# Patient Record
Sex: Female | Born: 1990 | Race: Black or African American | Hispanic: No | Smoking: Never smoker
Health system: Southern US, Community
[De-identification: ages and names within clinical notes are randomized; demographics above are authoritative.]

## PROBLEM LIST (undated history)

## (undated) DIAGNOSIS — F419 Anxiety disorder, unspecified: Secondary | ICD-10-CM

## (undated) DIAGNOSIS — R000 Tachycardia, unspecified: Secondary | ICD-10-CM

## (undated) DIAGNOSIS — M1990 Unspecified osteoarthritis, unspecified site: Secondary | ICD-10-CM

## (undated) HISTORY — PX: ABDOMINAL HYSTERECTOMY: SHX81

---

## 2018-09-18 ENCOUNTER — Emergency Department
Admission: EM | Admit: 2018-09-18 | Discharge: 2018-09-19 | Disposition: A | Discharge status: Home / Self Care | Payer: BLUE CROSS/BLUE SHIELD | Attending: Emergency Medicine | Admitting: Emergency Medicine

## 2018-09-18 ENCOUNTER — Encounter: Payer: Self-pay | Admitting: Emergency Medicine

## 2018-09-18 ENCOUNTER — Other Ambulatory Visit: Payer: Self-pay

## 2018-09-18 DIAGNOSIS — R1030 Lower abdominal pain, unspecified: Secondary | ICD-10-CM | POA: Insufficient documentation

## 2018-09-18 DIAGNOSIS — T7421XA Adult sexual abuse, confirmed, initial encounter: Secondary | ICD-10-CM

## 2018-09-18 LAB — URINE DRUG SCREEN, QUALITATIVE (ARMC ONLY)
Amphetamines, Ur Screen: NOT DETECTED
Barbiturates, Ur Screen: NOT DETECTED
Benzodiazepine, Ur Scrn: NOT DETECTED
Cannabinoid 50 Ng, Ur ~~LOC~~: NOT DETECTED
Cocaine Metabolite,Ur ~~LOC~~: NOT DETECTED
MDMA (Ecstasy)Ur Screen: NOT DETECTED
Methadone Scn, Ur: NOT DETECTED
Opiate, Ur Screen: NOT DETECTED
Phencyclidine (PCP) Ur S: NOT DETECTED
Tricyclic, Ur Screen: NOT DETECTED

## 2018-09-18 LAB — URINALYSIS, COMPLETE (UACMP) WITH MICROSCOPIC
Bacteria, UA: NONE SEEN
Bilirubin Urine: NEGATIVE
Glucose, UA: NEGATIVE mg/dL
Hgb urine dipstick: NEGATIVE
Ketones, ur: NEGATIVE mg/dL
Leukocytes,Ua: NEGATIVE
Nitrite: NEGATIVE
Protein, ur: NEGATIVE mg/dL
Specific Gravity, Urine: 1.021 (ref 1.005–1.030)
pH: 6 (ref 5.0–8.0)

## 2018-09-18 LAB — CBC
HCT: 36.7 % (ref 36.0–46.0)
Hemoglobin: 12.4 g/dL (ref 12.0–15.0)
MCH: 28.4 pg (ref 26.0–34.0)
MCHC: 33.8 g/dL (ref 30.0–36.0)
MCV: 84.2 fL (ref 80.0–100.0)
Platelets: 175 10*3/uL (ref 150–400)
RBC: 4.36 MIL/uL (ref 3.87–5.11)
RDW: 13.1 % (ref 11.5–15.5)
WBC: 8.1 10*3/uL (ref 4.0–10.5)
nRBC: 0 % (ref 0.0–0.2)

## 2018-09-18 LAB — COMPREHENSIVE METABOLIC PANEL
ALT: 19 U/L (ref 0–44)
AST: 23 U/L (ref 15–41)
Albumin: 4.1 g/dL (ref 3.5–5.0)
Alkaline Phosphatase: 63 U/L (ref 38–126)
Anion gap: 10 (ref 5–15)
BUN: 16 mg/dL (ref 6–20)
CO2: 27 mmol/L (ref 22–32)
Calcium: 8.8 mg/dL — ABNORMAL LOW (ref 8.9–10.3)
Chloride: 101 mmol/L (ref 98–111)
Creatinine, Ser: 0.7 mg/dL (ref 0.44–1.00)
GFR calc Af Amer: >60 mL/min (ref >60)
GFR calc non Af Amer: >60 mL/min (ref >60)
Glucose, Bld: 90 mg/dL (ref 70–99)
Potassium: 3.6 mmol/L (ref 3.5–5.1)
Sodium: 138 mmol/L (ref 135–145)
Total Bilirubin: 0.6 mg/dL (ref 0.3–1.2)
Total Protein: 7.5 g/dL (ref 6.5–8.1)

## 2018-09-18 LAB — HCG, QUANTITATIVE, PREGNANCY: hCG, Beta Chain, Quant, S: <1 m[IU]/mL (ref <5)

## 2018-09-18 MED ORDER — IBUPROFEN 400 MG PO TABS
400.0000 mg | ORAL_TABLET | Freq: Once | ORAL | Status: AC
  Administered 2018-09-18: 400 mg via ORAL
  Filled 2018-09-18: qty 1

## 2018-09-18 NOTE — SANE Note (Signed)
On 09/18/2018, at approximately 2345 hours, the SANE/FNE Naval architect) consult was completed. The primary RN and physician were notified. Please contact the SANE/FNE nurse on call (listed in [place]) with any further concerns.

## 2018-09-18 NOTE — ED Notes (Signed)
[facility] representative called back, they are not currently sending people out into the hospital but are offering phone services.  Spoke with pt and is she going to follow up with [facility] in [facility].
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
## 2018-09-18 NOTE — ED Notes (Signed)
Pt is requesting someone from [facility] to come and speak with her. Called [facility] 24 hour phone number [phone]). Left phone number for someone to call back.  Officer Isley aware and will also try to continue to contact them.
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
## 2018-09-18 NOTE — SANE Note (Signed)
ON 09/18/2018, AT APPROXIMATELY 2115 HOURS, I OBSERVED LAW ENFORCEMENT AND AN ADVOCATE TO BE PRESENT WITH THE PT IN [facility] ED ROOM # 5.

## 2018-09-18 NOTE — ED Notes (Signed)
ED Provider at bedside. 
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
## 2018-09-25 ENCOUNTER — Emergency Department: Payer: BLUE CROSS/BLUE SHIELD

## 2018-09-25 ENCOUNTER — Other Ambulatory Visit: Payer: Self-pay

## 2018-09-25 ENCOUNTER — Emergency Department
Admission: EM | Admit: 2018-09-25 | Discharge: 2018-09-26 | Disposition: A | Discharge status: Home / Self Care | Payer: BLUE CROSS/BLUE SHIELD | Attending: Emergency Medicine | Admitting: Emergency Medicine

## 2018-09-25 ENCOUNTER — Encounter: Payer: Self-pay | Admitting: *Deleted

## 2018-09-25 DIAGNOSIS — R109 Unspecified abdominal pain: Secondary | ICD-10-CM

## 2018-09-25 DIAGNOSIS — G8929 Other chronic pain: Secondary | ICD-10-CM | POA: Diagnosis not present

## 2018-09-25 DIAGNOSIS — R1030 Lower abdominal pain, unspecified: Secondary | ICD-10-CM | POA: Diagnosis not present

## 2018-09-25 LAB — URINALYSIS, COMPLETE (UACMP) WITH MICROSCOPIC
Bacteria, UA: NONE SEEN
Bilirubin Urine: NEGATIVE
Glucose, UA: NEGATIVE mg/dL
Hgb urine dipstick: NEGATIVE
Ketones, ur: NEGATIVE mg/dL
Leukocytes,Ua: NEGATIVE
Nitrite: NEGATIVE
Protein, ur: NEGATIVE mg/dL
Specific Gravity, Urine: 1.006 (ref 1.005–1.030)
pH: 5 (ref 5.0–8.0)

## 2018-09-25 LAB — CBC
HCT: 36.7 % (ref 36.0–46.0)
Hemoglobin: 12.3 g/dL (ref 12.0–15.0)
MCH: 28.5 pg (ref 26.0–34.0)
MCHC: 33.5 g/dL (ref 30.0–36.0)
MCV: 85.2 fL (ref 80.0–100.0)
Platelets: 190 10*3/uL (ref 150–400)
RBC: 4.31 MIL/uL (ref 3.87–5.11)
RDW: 13.1 % (ref 11.5–15.5)
WBC: 7.8 10*3/uL (ref 4.0–10.5)
nRBC: 0 % (ref 0.0–0.2)

## 2018-09-25 LAB — COMPREHENSIVE METABOLIC PANEL
ALT: 17 U/L (ref 0–44)
AST: 22 U/L (ref 15–41)
Albumin: 4.2 g/dL (ref 3.5–5.0)
Alkaline Phosphatase: 53 U/L (ref 38–126)
Anion gap: 8 (ref 5–15)
BUN: 13 mg/dL (ref 6–20)
CO2: 23 mmol/L (ref 22–32)
Calcium: 8.5 mg/dL — ABNORMAL LOW (ref 8.9–10.3)
Chloride: 108 mmol/L (ref 98–111)
Creatinine, Ser: 0.69 mg/dL (ref 0.44–1.00)
GFR calc Af Amer: >60 mL/min (ref >60)
GFR calc non Af Amer: >60 mL/min (ref >60)
Glucose, Bld: 87 mg/dL (ref 70–99)
Potassium: 3.7 mmol/L (ref 3.5–5.1)
Sodium: 139 mmol/L (ref 135–145)
Total Bilirubin: 0.5 mg/dL (ref 0.3–1.2)
Total Protein: 7.3 g/dL (ref 6.5–8.1)

## 2018-09-25 LAB — POCT PREGNANCY, URINE: Preg Test, Ur: NEGATIVE

## 2018-09-25 LAB — LIPASE, BLOOD: Lipase: 39 U/L (ref 11–51)

## 2018-09-25 MED ORDER — KETOROLAC TROMETHAMINE 30 MG/ML IJ SOLN
30.0000 mg | Freq: Once | INTRAMUSCULAR | Status: AC
  Administered 2018-09-25: 30 mg via INTRAVENOUS
  Filled 2018-09-25: qty 1

## 2018-09-25 MED ORDER — SODIUM CHLORIDE 0.9 % IV SOLN
Freq: Once | INTRAVENOUS | Status: AC
  Administered 2018-09-25: 22:00:00 via INTRAVENOUS

## 2018-09-25 MED ORDER — KETOROLAC TROMETHAMINE 60 MG/2ML IM SOLN: 60.0000 mg | Freq: Once | INTRAMUSCULAR | Status: DC

## 2018-09-25 MED ORDER — HYDROCODONE-ACETAMINOPHEN 5-325 MG PO TABS
2.0000 | ORAL_TABLET | Freq: Once | ORAL | Status: AC
  Administered 2018-09-25: 2 via ORAL
  Filled 2018-09-25: qty 2

## 2018-09-25 MED ORDER — IOHEXOL 300 MG/ML  SOLN
100.0000 mL | Freq: Once | INTRAMUSCULAR | Status: AC | PRN
  Administered 2018-09-25: 100 mL via INTRAVENOUS
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
## 2018-09-25 NOTE — ED Notes (Signed)
Pt requesting to hold off on bloodwork until she is seen by a doctor. Pt reporting UC sent her for the CT scan but she recently had blood work done.

## 2018-09-25 NOTE — ED Triage Notes (Signed)
First Nurse Note:  Arrives from [place] for ED evaluation of abdominal pain, dizziness, and blood in stool.    Patient is AAOx3. Skin warm and dry.  MAE equally and strong.  Ambulates with easy and steady gait.  NAD

## 2018-09-25 NOTE — ED Provider Notes (Signed)
-----------------------------------------   11:19 PM on 09/25/2018 -----------------------------------------   Blood pressure 126/86, pulse 87, temperature 99.2 F (37.3 C), temperature source Oral, resp. rate 16, height 5\' 4"  (1.626 m), weight 81.6 kg, SpO2 99 %.  Assuming care from Dr. Jimmye Norman of [facility] Ranieri is a 28 y.o. female with a chief complaint of Abdominal Pain and Nausea .    Please refer to H&P by previous MD for further details.  The current plan of care is to follow-up results of CT scan.  _________________________ 12:21 AM on 09/26/2018 -----------------------------------------  CT negative for any acute findings that could explain patient's pain which seems to be a chronic issue. Recommended f/u with her doctor. Discussed my standard return precautions.    I have personally reviewed the images performed during this visit and I agree with the Radiologist's read.   Interpretation by Radiologist:  Ct Abdomen Pelvis W Contrast  Result Date: 09/25/2018 CLINICAL DATA:  28 year old female with abdominal pain. EXAM: CT ABDOMEN AND PELVIS WITH CONTRAST TECHNIQUE: Multidetector CT imaging of the abdomen and pelvis was performed using the standard protocol following bolus administration of intravenous contrast. CONTRAST:  158mL OMNIPAQUE IOHEXOL 300 MG/ML  SOLN COMPARISON:  None. FINDINGS: Evaluation of this exam is limited due to respiratory motion artifact. Lower chest: The visualized lung bases are clear. No intra-abdominal free air or free fluid. Hepatobiliary: No focal liver abnormality is seen. No gallstones, gallbladder wall thickening, or biliary dilatation. Pancreas: Unremarkable. No pancreatic ductal dilatation or surrounding inflammatory changes. Spleen: Normal in size without focal abnormality. Adrenals/Urinary Tract: Adrenal glands are unremarkable. Kidneys are normal, without renal calculi, focal lesion, or hydronephrosis. Bladder is unremarkable. Stomach/Bowel: There is  moderate stool throughout the colon. There is no bowel obstruction or active inflammation. Normal appendix. Vascular/Lymphatic: No significant vascular findings are present. No enlarged abdominal or pelvic lymph nodes. Reproductive: Hysterectomy. No pelvic mass. A 2 cm right ovarian, probably ruptured, corpus luteum. The left ovary is unremarkable. Other: Small fat containing umbilical hernia. There is stranding of the fat within the umbilical hernia which may represent strangulation or incarceration. Correlation with clinical exam or point tenderness recommended. No fluid collection. Musculoskeletal: No acute or significant osseous findings. IMPRESSION: 1. A 2 cm probably ruptured right ovarian corpus luteum. 2. No bowel obstruction or active inflammation. Normal appendix. Electronically Signed   By: Anner Crete M.D.   On: 09/25/2018 23:31         Rudene Re, MD 09/26/18 Benancio Deeds
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
## 2018-09-26 NOTE — Discharge Instructions (Addendum)

## 2018-09-26 NOTE — ED Notes (Signed)
Pt c/o lower abdominal pain that pt describes as constant. Pt endorses constant nausea with abdominal pain as well. Pt denies vomiting or fever at home. Pt in NAD at this time.

## 2018-11-01 ENCOUNTER — Ambulatory Visit
Admission: EM | Admit: 2018-11-01 | Discharge: 2018-11-01 | Disposition: A | Discharge status: Home / Self Care | Payer: BLUE CROSS/BLUE SHIELD | Attending: Emergency Medicine | Admitting: Emergency Medicine

## 2018-11-01 ENCOUNTER — Other Ambulatory Visit: Payer: Self-pay

## 2018-11-01 ENCOUNTER — Encounter: Payer: Self-pay | Admitting: Emergency Medicine

## 2018-11-01 DIAGNOSIS — R300 Dysuria: Secondary | ICD-10-CM

## 2018-11-01 DIAGNOSIS — B373 Candidiasis of vulva and vagina: Secondary | ICD-10-CM

## 2018-11-01 DIAGNOSIS — B3731 Acute candidiasis of vulva and vagina: Secondary | ICD-10-CM

## 2018-11-01 LAB — URINALYSIS, COMPLETE (UACMP) WITH MICROSCOPIC
Bacteria, UA: NONE SEEN
Bilirubin Urine: NEGATIVE
Glucose, UA: NEGATIVE mg/dL
Hgb urine dipstick: NEGATIVE
Ketones, ur: NEGATIVE mg/dL
Nitrite: NEGATIVE
Protein, ur: NEGATIVE mg/dL
RBC / HPF: NONE SEEN RBC/hpf (ref 0–5)
Specific Gravity, Urine: 1.015 (ref 1.005–1.030)
pH: 6 (ref 5.0–8.0)

## 2018-11-01 LAB — WET PREP, GENITAL
Clue Cells Wet Prep HPF POC: NONE SEEN
Sperm: NONE SEEN
Trich, Wet Prep: NONE SEEN

## 2018-11-01 MED ORDER — FLUCONAZOLE 150 MG PO TABS: 150.0000 mg | ORAL_TABLET | Freq: Every day | ORAL | 0 refills | Status: DC

## 2018-11-01 NOTE — Discharge Instructions (Addendum)
Take medication as prescribed. Rest. Drink plenty of fluids. Pelvic rest.  ° °Follow up with your primary care physician this week as needed. Return to Urgent care for new or worsening concerns.  ° °

## 2018-11-01 NOTE — ED Provider Notes (Signed)
[facility] ____________________________________________  Time seen: Approximately 5:20 PM  I have reviewed the triage vital signs and the nursing notes.   HISTORY  Chief Complaint Urinary Frequency   HPI Mandy Robertson is a 28 y.o. female presenting for evaluation of vaginal discharge present since Saturday.  States vaginal discharge is whitish and thin.  States seems similar to previous bacterial vaginosis.  Also reports some burning with urination and when the urine hits the skin.  States has some generalized low back pain worse on the right than the left that is present with movement and position changes.  Denies abdominal pain.  Some suprapubic pressure.  Continues eat and drink well.  Denies concerns of STDs.  Denies pregnancy.  Denies cough, fevers or recent sickness.  Reports otherwise doing well.  Denies other aggravating leaving factors.  No LMP recorded. Patient has had a hysterectomy.   History reviewed. No pertinent past medical history.  There are no active problems to display for this patient.   Past Surgical History:  Procedure Laterality Date  . ABDOMINAL HYSTERECTOMY       No current facility-administered medications for this encounter.   Current Outpatient Medications:  .  fluconazole (DIFLUCAN) 150 MG tablet, Take 1 tablet (150 mg total) by mouth daily. Take one pill orally, then Repeat in 3 days as needed., Disp: 2 tablet, Rfl: 0  Allergies Patient has no known allergies.  No family history on file.  Social History Social History   Tobacco Use  . Smoking status: Never Smoker  . Smokeless tobacco: Never Used  Substance Use Topics  . Alcohol use: Yes  . Drug use: Not Currently    Review of Systems Constitutional: No fever ENT: No sore throat. Cardiovascular: Denies chest pain. Respiratory: Denies shortness of breath. Gastrointestinal: No nausea, no vomiting.  No diarrhea.  No constipation. Genitourinary: Positive for dysuria.  Musculoskeletal: Positive for back pain. Skin: Negative for rash.   ____________________________________________   PHYSICAL EXAM:  VITAL SIGNS: ED Triage Vitals  Enc Vitals Group     BP 11/01/18 1624 120/78     Pulse Rate 11/01/18 1624 (!) 103     Resp 11/01/18 1624 15     Temp 11/01/18 1624 98.6 F (37 C)     Temp Source 11/01/18 1624 Oral     SpO2 11/01/18 1624 100 %     Weight 11/01/18 1625 180 lb (81.6 kg)     Height 11/01/18 1625 5\' 4"  (1.626 m)     Head Circumference --      Peak Flow --      Pain Score 11/01/18 1625 5     Pain Loc --      Pain Edu? --      Excl. in [place]? --     Constitutional: Alert and oriented. Well appearing and in no acute distress. Eyes: Conjunctivae are normal.  ENT      Head: Normocephalic and atraumatic. Cardiovascular: Normal rate, regular rhythm. Grossly normal heart sounds.  Good peripheral circulation. Respiratory: Normal respiratory effort without tachypnea nor retractions. Breath sounds are clear and equal bilaterally. No wheezes, rales, rhonchi. Gastrointestinal: Minimal midline suprapubic pressure to palpation.  Abdomen otherwise soft and nontender.  No CVA tenderness. Musculoskeletal: No midline cervical, thoracic or lumbar tenderness to palpation.  Mild bilateral paralumbar tenderness palpation, right greater than left, no CVA tenderness, pain fully reproducible by overhead stretching. Neurologic:  Normal speech and language.Speech is normal. No gait instability.  Skin:  Skin is warm, dry  and intact. No rash noted. Psychiatric: Mood and affect are normal. Speech and behavior are normal. Patient exhibits appropriate insight and judgment   ___________________________________________   LABS (all labs ordered are listed, but only abnormal results are displayed)  Labs Reviewed  WET PREP, GENITAL - Abnormal; Notable for the following components:      Result Value   Yeast Wet Prep HPF POC PRESENT (*)    WBC, Wet Prep HPF POC RARE (*)     All other components within normal limits  URINALYSIS, COMPLETE (UACMP) WITH MICROSCOPIC - Abnormal; Notable for the following components:   Color, Urine STRAW (*)    Leukocytes,Ua TRACE (*)    All other components within normal limits  URINE CULTURE   ____________________________________________  PROCEDURES Procedures    INITIAL IMPRESSION / ASSESSMENT AND PLAN / ED COURSE  Pertinent labs & imaging results that were available during my care of the patient were reviewed by me and considered in my medical decision making (see chart for details).  Well-appearing patient.  No acute distress.  Patient elected for self wet prep.  Wet prep positive for yeast vaginitis.  Urinalysis overall unremarkable.  Also suspect low back strain.  Over-the-counter ibuprofen, rest, heat, ice, supportive care.  Will treat with Diflucan.  Pelvic rest.Discussed indication, risks and benefits of medications with patient.  Discussed follow up with Primary care physician this week. Discussed follow up and return parameters including no resolution or any worsening concerns. Patient verbalized understanding and agreed to plan.   ____________________________________________   FINAL CLINICAL IMPRESSION(S) / ED DIAGNOSES  Final diagnoses:  Dysuria  Yeast vaginitis     ED Discharge Orders         Ordered    fluconazole (DIFLUCAN) 150 MG tablet  Daily     11/01/18 1721           Note: This dictation was prepared with Dragon dictation along with smaller phrase technology. Any transcriptional errors that result from this process are unintentional.         Renford DillsMiller, Littie Chiem, NP 11/01/18 1737

## 2018-11-01 NOTE — ED Triage Notes (Addendum)
Pt here with c/o urinary frequency and pain since Saturday, reports right flank pain since yesterday. Also reports thin, white vaginal discharge.

## 2018-11-02 LAB — URINE CULTURE: Culture: NO GROWTH

## 2018-11-03 ENCOUNTER — Ambulatory Visit
Admission: EM | Admit: 2018-11-03 | Discharge: 2018-11-03 | Disposition: A | Discharge status: Home / Self Care | Payer: BLUE CROSS/BLUE SHIELD | Attending: Urgent Care | Admitting: Urgent Care

## 2018-11-03 ENCOUNTER — Encounter: Payer: Self-pay | Admitting: Emergency Medicine

## 2018-11-03 ENCOUNTER — Other Ambulatory Visit: Payer: Self-pay

## 2018-11-03 DIAGNOSIS — M545 Low back pain, unspecified: Secondary | ICD-10-CM

## 2018-11-03 LAB — URINALYSIS, COMPLETE (UACMP) WITH MICROSCOPIC
Bilirubin Urine: NEGATIVE
Glucose, UA: NEGATIVE mg/dL
Hgb urine dipstick: NEGATIVE
Ketones, ur: NEGATIVE mg/dL
Leukocytes,Ua: NEGATIVE
Nitrite: NEGATIVE
Protein, ur: NEGATIVE mg/dL
Specific Gravity, Urine: 1.02 (ref 1.005–1.030)
pH: 5 (ref 5.0–8.0)

## 2018-11-03 MED ORDER — TRAMADOL HCL 50 MG PO TABS: 50.0000 mg | ORAL_TABLET | Freq: Three times a day (TID) | ORAL | 0 refills | Status: DC | PRN

## 2018-11-03 MED ORDER — METHOCARBAMOL 500 MG PO TABS: 500.0000 mg | ORAL_TABLET | Freq: Two times a day (BID) | ORAL | 0 refills | Status: DC

## 2018-11-03 NOTE — ED Provider Notes (Signed)
[place], [place]   Name: Mandy Robertson DOB: 20-Feb-1991 MRN: [id number] CSN: [id number] PCP: [facility] [place] date and time:  11/03/18 1635  Chief Complaint:  Back Pain   NOTE: Prior to seeing the patient today, I have reviewed the triage nursing documentation and vital signs. Clinical staff has updated patient's PMH/PSHx, current medication list, and drug allergies/intolerances to ensure comprehensive history available to assist in medical decision making.   History:   HPI: Mandy Robertson is a 28 y.o. female who presents today with complaints of lower back pain that began with acute onset about a week ago. Patient denies any known injuries or heavy lifting. Pain is mainly in the RIGHT side of her back. She has experienced concurrent urinary frequency and dysuria. Patient was seen here on 11/01/2018 with similar symptoms. UA was negative for infection, however patient feels like her back pain and dysuria worsened yesterday. Patient denies radiation of her pain into her lower extremities. She has not appreciated any weakness or distal paraesthesias in her legs. Patient denies any acute issues with bowel or bladder incontinence. No saddle anesthesia. Patient notes that pain is exacerbated by standing, bending, and twisting. She has attempted conservative management at home using ibuprofen, in addition to applying heat/ice, which has done little to improve her symptoms. PMH does not include any previous back injuries or surgeries.  History reviewed. No pertinent past medical history.  Past Surgical History:  Procedure Laterality Date   ABDOMINAL HYSTERECTOMY      Family History  Problem Relation Age of Onset   Healthy Mother    Healthy Father     Social History   Tobacco Use   Smoking status: Never Smoker   Smokeless tobacco: Never Used  Substance Use Topics   Alcohol use: Yes   Drug use: Not Currently    There are no active problems to display for this  patient.   Home Medications:    No outpatient medications have been marked as taking for the 11/03/18 encounter [facility] Encounter).    Allergies:   Patient has no known allergies.  Review of Systems (ROS): Review of Systems  Constitutional: Negative for chills and fever.  Respiratory: Negative for cough and shortness of breath.   Cardiovascular: Negative for chest pain and palpitations.  Genitourinary: Positive for dysuria and frequency. Negative for decreased urine volume, difficulty urinating, flank pain, hematuria, pelvic pain, urgency, vaginal bleeding, vaginal discharge and vaginal pain.  Musculoskeletal: Positive for back pain. Negative for myalgias.  Skin: Negative for color change, pallor and rash.  Neurological: Negative for dizziness, syncope, weakness and headaches.  All other systems reviewed and are negative.    Vital Signs: Today's Vitals   11/03/18 1720 11/03/18 1723 11/03/18 1818  BP:  116/81   Pulse:  92   Resp:  18   Temp:  98.4 F (36.9 C)   TempSrc:  Oral   SpO2:  100%   Weight: 180 lb (81.6 kg)    Height: 5\' 4"  (1.626 m)    PainSc: 9   9     Physical Exam: Physical Exam  Constitutional: She is oriented to person, place, and time and well-developed, well-nourished, and in no distress.  HENT:  Head: Normocephalic and atraumatic.  Mouth/Throat: Mucous membranes are normal.  Eyes: Pupils are equal, round, and reactive to light. EOM are normal.  Neck: Normal range of motion and full passive range of motion without pain. Neck supple. No tracheal deviation present.  Cardiovascular: Normal rate, regular  rhythm, normal heart sounds and intact distal pulses. Exam reveals no gallop and no friction rub.  No murmur heard. Pulmonary/Chest: Effort normal and breath sounds normal. No respiratory distress. She has no wheezes. She has no rales.  Abdominal: Soft. Normal appearance and bowel sounds are normal. There is no hepatosplenomegaly. There is no abdominal  tenderness. There is no CVA tenderness.  Musculoskeletal:     Lumbar back: She exhibits tenderness and pain. She exhibits no swelling and no deformity.       Back:     Comments: No midline tenderness or deformity.   Lymphadenopathy:    She has no cervical adenopathy.  Neurological: She is alert and oriented to person, place, and time. Gait normal. GCS score is 15.  Skin: Skin is warm and dry. No rash noted.  Psychiatric: Mood, memory, affect and judgment normal.  Nursing note and vitals reviewed.   Urgent Care Treatments / Results:   LABS: PLEASE NOTE: all labs that were ordered this encounter are listed, however only abnormal results are displayed. Labs Reviewed  URINALYSIS, COMPLETE (UACMP) WITH MICROSCOPIC - Abnormal; Notable for the following components:      Result Value   Bacteria, UA FEW (*)    All other components within normal limits    EKG: -None  RADIOLOGY: No results found.  PROCEDURES: Procedures  MEDICATIONS RECEIVED THIS VISIT: Medications - No data to display  PERTINENT CLINICAL COURSE NOTES/UPDATES:   Initial Impression / Assessment and Plan / Urgent Care Course:  Pertinent labs & imaging results that were available during my care of the patient were personally reviewed by me and considered in my medical decision making (see lab/imaging section of note for values and interpretations).  Mandy Robertson is a 28 y.o. female who [facility] today with complaints of Back Pain   Patient is well appearing overall in clinic today. She does not appear to be in any acute distress. Presenting symptoms (see HPI) and exam as documented above. UA repeated due to complaints of worsening dysuria. Results reviewed as negative for infection. In the absence of known injury, diagnostic radiographs are unlikely to be beneficial. Symptoms and exam consistent with lower back strain with some mild associated spasm to the RIGHT paralumbar muscle group. Will add  skeletal muscle relaxer (methocarbamol) to patient's current IBU/APAP therapy. She was educated on complimentary modalities to help with her pain. Patient encouraged to rest and avoid twisting/bending/lifting. She will likely find added benefit of applying moist heat TID for at least 10-15 minutes at a time; written information provided on today's AVS. Will also provide a short course of Tramadol for severe pain, as she complains of difficulty sleeping due to her pain. She was advised to avoid taking SMR and opioid pain medication at the same time; side effects of both reviewed.   Discussed follow up with primary care physician in 1 week for re-evaluation. I have reviewed the follow up and strict return precautions for any new or worsening symptoms. Patient is aware of symptoms that would be deemed urgent/emergent, and would thus require further evaluation either here or in the emergency department. At the time of discharge, she verbalized understanding and consent with the discharge plan as it was reviewed with her. All questions were fielded by provider and/or clinic staff prior to patient discharge.    Final Clinical Impressions / Urgent Care Diagnoses:   Final diagnoses:  Acute right-sided low back pain without sciatica    New Prescriptions:  [place]  Controlled Substance Registry consulted? Yes, I have consulted the [place] Controlled Substances Registry for this patient, and feel the risk/benefit ratio today is favorable for proceeding with this prescription for a controlled substance.   Discussed use of controlled substance medication to treat her acute pain.  o Reviewed [place] STOP Act regulations  o Clinic does not refill controlled substances over the phone without face to face evaluation.   Safety precautions reviewed.  o Medications should not be bitten, chewed, crushed, shared, or taken with alcohol.  o Avoid use while working, driving, or operating heavy machinery.  o Side effects associated with  the use of this particular medication reviewed. - Patient understands that this medication can cause CNS depression, increase her risk of falls, and even lead to overdose that may result in death, if used outside of the parameters that she and I discussed.  With all of this in mind, she knowingly accepts the risks and responsibilities associated with intended course of treatment, and elects to responsibly proceed as discussed.  Meds ordered this encounter  Medications   methocarbamol (ROBAXIN) 500 MG tablet    Sig: Take 1 tablet (500 mg total) by mouth 2 (two) times daily.    Dispense:  10 tablet    Refill:  0   traMADol (ULTRAM) 50 MG tablet    Sig: Take 1 tablet (50 mg total) by mouth 3 (three) times daily as needed.    Dispense:  12 tablet    Refill:  0    Recommended Follow up Care:  Patient encouraged to follow up with the following provider within the specified time frame, or sooner as dictated by the severity of her symptoms. As always, she was instructed that for any urgent/emergent care needs, she should seek care either here or in the emergency department for more immediate evaluation.  Follow-up Information    PCP In 1 week.   Why: General reassessment of symptoms if not improving        NOTE: This note was prepared using Scientist, clinical (histocompatibility and immunogenetics)Dragon dictation software along with smaller Lobbyistphrase technology. Despite my best ability to proofread, there is the potential that transcriptional errors may still occur from this process, and are completely unintentional.     Verlee MonteGray, Shery Wauneka E, NP 11/03/18 2313

## 2018-11-03 NOTE — ED Triage Notes (Signed)
Pt c/o lower back pain on the right side. Started about a week ago. She has tried ibuprofen, and heating pad without relief. No known injury.

## 2018-11-03 NOTE — Discharge Instructions (Addendum)
It was very nice seeing you today in clinic. Thank you for entrusting me with your care.   As discussed, your pain seems to be musculoskeletal in nature. Plans for treating you are as follows:  Please utilize the medications that we discussed. Your prescriptions have been called in to your pharmacy.  Use Tylenol and/or Ibuprofen as needed for pain. May add the Tramadol for severe pain, but DO NOT drive after taking it. DO NOT take the Tramadol and Robaxin together. Avoid overdoing it, but you need to make efforts to remain active as tolerated.  Avoiding activity all together can make your pain worse. You may find that alternating between ice and moist heat application will help with your pain.  Heat/ice should be applied for 10-15 minutes at a time at least 3-4 times a day.  Make arrangements to follow up with your regular doctor in 1 week for re-evaluation. If your symptoms/condition worsens, please seek follow up care either here or in the ER. Please remember, our [place] providers are "right here with you" when you need [place].   Again, it was my pleasure to take care of you today. Thank you for choosing our clinic. I hope that you start to feel better quickly.   Honor Loh, MSN, APRN, FNP-C, CEN Advanced Practice Provider [facility]

## 2018-11-24 ENCOUNTER — Other Ambulatory Visit: Payer: Self-pay

## 2018-11-24 ENCOUNTER — Encounter: Payer: Self-pay | Admitting: *Deleted

## 2018-11-24 DIAGNOSIS — R0789 Other chest pain: Secondary | ICD-10-CM | POA: Diagnosis present

## 2018-11-24 LAB — CBC
HCT: 38.4 % (ref 36.0–46.0)
Hemoglobin: 12.8 g/dL (ref 12.0–15.0)
MCH: 27.5 pg (ref 26.0–34.0)
MCHC: 33.3 g/dL (ref 30.0–36.0)
MCV: 82.6 fL (ref 80.0–100.0)
Platelets: 171 10*3/uL (ref 150–400)
RBC: 4.65 MIL/uL (ref 3.87–5.11)
RDW: 12.9 % (ref 11.5–15.5)
WBC: 7.4 10*3/uL (ref 4.0–10.5)
nRBC: 0 % (ref 0.0–0.2)

## 2018-11-24 LAB — BASIC METABOLIC PANEL
Anion gap: 5 (ref 5–15)
BUN: 9 mg/dL (ref 6–20)
CO2: 24 mmol/L (ref 22–32)
Calcium: 9 mg/dL (ref 8.9–10.3)
Chloride: 107 mmol/L (ref 98–111)
Creatinine, Ser: 0.74 mg/dL (ref 0.44–1.00)
GFR calc Af Amer: >60 mL/min (ref >60)
GFR calc non Af Amer: >60 mL/min (ref >60)
Glucose, Bld: 107 mg/dL — ABNORMAL HIGH (ref 70–99)
Potassium: 2.9 mmol/L — ABNORMAL LOW (ref 3.5–5.1)
Sodium: 136 mmol/L (ref 135–145)

## 2018-11-24 LAB — TROPONIN I (HIGH SENSITIVITY): Troponin I (High Sensitivity): 2 ng/L (ref <18)

## 2018-11-24 NOTE — ED Triage Notes (Addendum)
Pt to ED reporting intermittent chest pain and palpitations for the past 2 weeks. Pain is worse when laying back. Pt reporting intermittent lower back and pelvic pain as well as SOB without a cough. No fevers.

## 2018-11-25 ENCOUNTER — Other Ambulatory Visit: Payer: Self-pay

## 2018-11-25 ENCOUNTER — Ambulatory Visit
Admission: EM | Admit: 2018-11-25 | Discharge: 2018-11-25 | Disposition: A | Discharge status: Home / Self Care | Payer: BLUE CROSS/BLUE SHIELD | Source: Home / Self Care | Attending: Family Medicine | Admitting: Family Medicine

## 2018-11-25 ENCOUNTER — Emergency Department: Payer: BLUE CROSS/BLUE SHIELD

## 2018-11-25 ENCOUNTER — Emergency Department
Admission: EM | Admit: 2018-11-25 | Discharge: 2018-11-25 | Disposition: A | Discharge status: Home / Self Care | Payer: BLUE CROSS/BLUE SHIELD | Attending: Emergency Medicine | Admitting: Emergency Medicine

## 2018-11-25 DIAGNOSIS — R102 Pelvic and perineal pain: Secondary | ICD-10-CM | POA: Diagnosis not present

## 2018-11-25 DIAGNOSIS — G8929 Other chronic pain: Secondary | ICD-10-CM | POA: Diagnosis not present

## 2018-11-25 DIAGNOSIS — R079 Chest pain, unspecified: Secondary | ICD-10-CM

## 2018-11-25 LAB — FIBRIN DERIVATIVES D-DIMER (ARMC ONLY): Fibrin derivatives D-dimer (ARMC): 1060.21 ng/mL (FEU) — ABNORMAL HIGH (ref 0.00–499.00)

## 2018-11-25 LAB — TROPONIN I (HIGH SENSITIVITY): Troponin I (High Sensitivity): <2 ng/L (ref <18)

## 2018-11-25 MED ORDER — IOHEXOL 350 MG/ML SOLN
75.0000 mL | Freq: Once | INTRAVENOUS | Status: AC | PRN
  Administered 2018-11-25: 75 mL via INTRAVENOUS

## 2018-11-25 MED ORDER — POTASSIUM CHLORIDE CRYS ER 20 MEQ PO TBCR
40.0000 meq | EXTENDED_RELEASE_TABLET | Freq: Once | ORAL | Status: AC
  Administered 2018-11-25: 05:00:00 40 meq via ORAL
  Filled 2018-11-25: qty 2

## 2018-11-25 NOTE — ED Notes (Signed)
Pt to the er for mid-sternal chest pain that is intermittent. Pt says it occurs when she is lying down or when she is going upstairs. Pt denies cardiac hx. No swelling noted. Pt states she has had a full cardiac workup this year including holter monitor and echo. Pt is in no acute distress at this time. MD at bedside.

## 2018-11-25 NOTE — ED Triage Notes (Signed)
Patient complains of pelvic pain that has been "always been there" but worsening over last week. Patient states that pain is worse with coughing or laughing.

## 2018-11-25 NOTE — ED Provider Notes (Signed)
[facility]    CSN: [id number] Arrival date & time: 11/25/18  1556      History   Chief Complaint Chief Complaint  Patient presents with  . Pelvic Pain    HPI Mandy Robertson is a 28 y.o. female.   28 yo female with a h/o chronic pelvic pain presents with a c/o of the same. States that she had a hysterectomy back in January for her chronic pelvic pain but that "it never went away". Denies any fevers, chills, dysuria, hematuria, vaginal bleeding, vaginal discharge, melena, hematochezia, diarrhea, constipation, nausea/vomiting. Patient had an abdominal/pelvic CT scan 2 months ago (negative). Was also seen yesterday in ED for chest pain and had blood work and chest CT done.    Pelvic Pain    History reviewed. No pertinent past medical history.  There are no active problems to display for this patient.   Past Surgical History:  Procedure Laterality Date  . ABDOMINAL HYSTERECTOMY      OB History   No obstetric history on file.      Home Medications    Prior to Admission medications   Medication Sig Start Date End Date Taking? Authorizing Provider  methocarbamol (ROBAXIN) 500 MG tablet Take 1 tablet (500 mg total) by mouth 2 (two) times daily. 11/03/18   Verlee MonteGray, Bryan E, NP  traMADol (ULTRAM) 50 MG tablet Take 1 tablet (50 mg total) by mouth 3 (three) times daily as needed. 11/03/18   Verlee MonteGray, Bryan E, NP    Family History Family History  Problem Relation Age of Onset  . Healthy Mother   . Healthy Father     Social History Social History   Tobacco Use  . Smoking status: Never Smoker  . Smokeless tobacco: Never Used  Substance Use Topics  . Alcohol use: Yes  . Drug use: Not Currently     Allergies   Patient has no known allergies.   Review of Systems Review of Systems  Genitourinary: Positive for pelvic pain.     Physical Exam Triage Vital Signs ED Triage Vitals  Enc Vitals Group     BP 11/25/18 1615 126/79     Pulse Rate 11/25/18 1615 93      Resp 11/25/18 1615 17     Temp 11/25/18 1615 98.8 F (37.1 C)     Temp Source 11/25/18 1615 Oral     SpO2 11/25/18 1615 100 %     Weight 11/25/18 1613 180 lb (81.6 kg)     Height 11/25/18 1613 5\' 4"  (1.626 m)     Head Circumference --      Peak Flow --      Pain Score 11/25/18 1613 8     Pain Loc --      Pain Edu? --      Excl. in [place]? --    No data found.  Updated Vital Signs BP 126/79 (BP Location: Right Arm)   Pulse 93   Temp 98.8 F (37.1 C) (Oral)   Resp 17   Ht 5\' 4"  (1.626 m)   Wt 81.6 kg   SpO2 100%   BMI 30.90 kg/m   Visual Acuity Right Eye Distance:   Left Eye Distance:   Bilateral Distance:    Right Eye Near:   Left Eye Near:    Bilateral Near:     Physical Exam Vitals signs and nursing note reviewed.  Constitutional:      General: She is not in acute distress.    Appearance:  She is not toxic-appearing or diaphoretic.  Abdominal:     General: Bowel sounds are normal. There is no distension.     Palpations: Abdomen is soft. There is no mass.     Tenderness: There is abdominal tenderness (mild, bilateral lower abdominal/pelvic to palpation; no rebound or guarding). There is no right CVA tenderness, left CVA tenderness, guarding or rebound.     Hernia: No hernia is present.  Neurological:     Mental Status: She is alert.      UC Treatments / Results  Labs (all labs ordered are listed, but only abnormal results are displayed) Labs Reviewed - No data to display  EKG   Radiology Ct Angio Chest Pe W And/or Wo Contrast  Addendum Date: 11/25/2018   ADDENDUM REPORT: 11/25/2018 05:15 ADDENDUM: While there is significant opacification of the thoracic aorta, there does appear to be satisfactory opacification of the pulmonary arteries. No definite evidence of pulmonary emboli is noted. Electronically Signed   By: Lupita RaiderJames  Green Jr M.D.   On: 11/25/2018 05:15   Result Date: 11/25/2018 CLINICAL DATA:  Chest pain, complex, no cardiac history EXAM: CT  ANGIOGRAPHY CHEST WITH CONTRAST TECHNIQUE: Multidetector CT imaging of the chest was performed using the standard protocol during bolus administration of intravenous contrast. Multiplanar CT image reconstructions and MIPs were obtained to evaluate the vascular anatomy. CONTRAST:  75mL OMNIPAQUE IOHEXOL 350 MG/ML SOLN COMPARISON:  CT abdomen pelvis September 25, 2018 FINDINGS: Cardiovascular: Preferential opacification of the thoracic aorta. The aortic root is suboptimally assessed given cardiac pulsation artifact on this nongated exam. The aorta is normal caliber. Shared origin of the brachiocephalic and left common carotid arteries. No intramural hematoma, dissection flap or other luminal abnormality of the aorta is seen. No periaortic stranding or hemorrhage. Normal heart size. No pericardial effusion. Pulmonary arteries are normal caliber. Study is not tailored for the evaluation of pulmonary embolism though no large central filling defects are identified. Mediastinum/Nodes: Small wedge-shaped soft tissue attenuation in the anterior mediastinum/prevascular spaces most compatible with a small thymic remnant. No enlarged mediastinal or axillary lymph nodes. Thyroid gland, trachea, and esophagus demonstrate no significant findings. Lungs/Pleura: Atelectatic changes or scarring in the medial left lung base. No consolidation, features of edema, pneumothorax, or effusion. Upper Abdomen: No acute abnormalities present in the visualized portions of the upper abdomen. Musculoskeletal: No chest wall abnormality. No acute or significant osseous findings. Review of the MIP images confirms the above findings. IMPRESSION: 1. No evidence of acute aortic syndrome. 2. No acute intrathoracic process. 3. 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 Electronically Signed: By: Kreg ShropshirePrice  DeHay M.D. On: 11/25/2018 04:01   Dg Chest Port 1 View  Result Date: 11/25/2018 CLINICAL DATA:  Chest pain and palpitations.  Shortness of breath. EXAM: PORTABLE CHEST 1 VIEW COMPARISON:  None. FINDINGS: The cardiomediastinal contours are normal. Mild bibasilar atelectasis. Pulmonary vasculature is normal. No consolidation, pleural effusion, or pneumothorax. No acute osseous abnormalities are seen. IMPRESSION: Mild bibasilar atelectasis. Electronically Signed   By: Narda RutherfordMelanie  Sanford M.D.   On: 11/25/2018 01:54    Procedures Procedures (including critical care time)  Medications Ordered in UC Medications - No data to display  Initial Impression / Assessment and Plan / UC Course  I have reviewed the triage vital signs and the nursing notes.  Pertinent labs & imaging results that were available during my care of the patient were reviewed by me and considered in my medical decision making (see chart for details).  Final Clinical Impressions(s) / UC Diagnoses   Final diagnoses:  Chronic pelvic pain in female     Discharge Instructions     Over the counter advil/tylenol Follow up with your gynecologist    ED Prescriptions    None      1.diagnosis reviewed with patient 2. Recommend supportive treatment as above  4. Follow-up prn   Controlled Substance Prescriptions [place] Controlled Substance Registry consulted? Not Applicable   Norval Gable, MD 11/25/18 [phone]

## 2018-11-25 NOTE — Discharge Instructions (Signed)
Over the counter advil/tylenol Follow up with your gynecologist

## 2018-11-25 NOTE — ED Provider Notes (Signed)
[facility] Emergency Department Provider Note    First MD Initiated Contact with Patient 11/25/18 [phone]     (approximate)  I have reviewed the triage vital signs and the nursing notes.   HISTORY  Chief Complaint Chest Pain    HPI Mandy Robertson is a 28 y.o. female with medical history of chronic pelvic pain chronic back pain presents to the emergency department for 2-week history of intermittent chest pain with associated palpitations.  Patient denies any pain at present.  Patient also admits to dyspnea.  Patient denies any cough.  Patient denies any fever.  Patient denies any lower extremity pain or swelling.  Patient denies any personal or familial history of DVT PE or CAD.    Past medical history  There are no active problems to display for this patient.   Past Surgical History:  Procedure Laterality Date   ABDOMINAL HYSTERECTOMY      Prior to Admission medications   Medication Sig Start Date End Date Taking? Authorizing Provider  methocarbamol (ROBAXIN) 500 MG tablet Take 1 tablet (500 mg total) by mouth 2 (two) times daily. 11/03/18   Karen Kitchens, NP  traMADol (ULTRAM) 50 MG tablet Take 1 tablet (50 mg total) by mouth 3 (three) times daily as needed. 11/03/18   Karen Kitchens, NP    Allergies Patient has no known allergies.  Family History  Problem Relation Age of Onset   Healthy Mother    Healthy Father     Social History Social History   Tobacco Use   Smoking status: Never Smoker   Smokeless tobacco: Never Used  Substance Use Topics   Alcohol use: Yes   Drug use: Not Currently    Review of Systems Constitutional: No fever/chills Eyes: No visual changes. ENT: No sore throat. Cardiovascular: Positive for chest pain and palpitations Respiratory: Denies shortness of breath. Gastrointestinal: No abdominal pain.  No nausea, no vomiting.  No diarrhea.  No constipation. Genitourinary: Negative for dysuria. Musculoskeletal:  Negative for neck pain.  Negative for back pain. Integumentary: Negative for rash. Neurological: Negative for headaches, focal weakness or numbness.  ____________________________________________   PHYSICAL EXAM:  VITAL SIGNS: ED Triage Vitals [11/24/18 2218]  Enc Vitals Group     BP 119/83     Pulse Rate 92     Resp 16     Temp 98.4 F (36.9 C)     Temp Source Oral     SpO2 100 %     Weight 81.6 kg (180 lb)     Height 1.626 m (5\' 4" )     Head Circumference      Peak Flow      Pain Score 9     Pain Loc      Pain Edu?      Excl. in [place]?     Constitutional: Alert and oriented.  Eyes: Conjunctivae are normal.  Mouth/Throat: Mucous membranes are moist. Neck: No stridor.  No meningeal signs.   Cardiovascular: Normal rate, regular rhythm. Good peripheral circulation. Grossly normal heart sounds. Respiratory: Normal respiratory effort.  No retractions. Gastrointestinal: Soft and nontender. No distention.   Musculoskeletal: No lower extremity tenderness nor edema. No gross deformities of extremities. Neurologic:  Normal speech and language. No gross focal neurologic deficits are appreciated.  Skin:  Skin is warm, dry and intact. Psychiatric: Mood and affect are normal. Speech and behavior are normal.  ____________________________________________   LABS (all labs ordered are listed, but only abnormal results are  displayed)  Labs Reviewed  BASIC METABOLIC PANEL - Abnormal; Notable for the following components:      Result Value   Potassium 2.9 (*)    Glucose, Bld 107 (*)    All other components within normal limits  FIBRIN DERIVATIVES D-DIMER ([facility] ONLY) - Abnormal; Notable for the following components:   Fibrin derivatives D-dimer ([facility]) 1,060.21 (*)    All other components within normal limits  CBC  POC URINE PREG, ED  TROPONIN I (HIGH SENSITIVITY)  TROPONIN I (HIGH SENSITIVITY)   ____________________________________________  EKG ED ECG REPORT I, [place] N  Kyleah [place], the attending physician, personally viewed and interpreted this ECG.   Date: 11/25/2018  EKG Time: 10:14 PM  Rate: 99  Rhythm: Normal sinus rhythm  Axis: Normal  Intervals: Normal  ST&T Change: None   ____________________________________________  RADIOLOGY I, [place] N Khalen Styer, personally viewed and evaluated these images (plain radiographs) as part of my medical decision making, as well as reviewing the written report by the radiologist.  ED MD interpretation: Chest x-ray revealed mild bibasilar atelectasis.  CT scan of the chest revealed no evidence of PE per radiologist.  No acute intrathoracic abnormality.  Official radiology report(s): Ct Angio Chest Pe W And/or Wo Contrast  Addendum Date: 11/25/2018   ADDENDUM REPORT: 11/25/2018 05:15 ADDENDUM: While there is significant opacification of the thoracic aorta, there does appear to be satisfactory opacification of the pulmonary arteries. No definite evidence of pulmonary emboli is noted. Electronically Signed   By: Lupita RaiderJames  Green Jr M.D.   On: 11/25/2018 05:15   Result Date: 11/25/2018 CLINICAL DATA:  Chest pain, complex, no cardiac history EXAM: CT ANGIOGRAPHY CHEST WITH CONTRAST TECHNIQUE: Multidetector CT imaging of the chest was performed using the standard protocol during bolus administration of intravenous contrast. Multiplanar CT image reconstructions and MIPs were obtained to evaluate the vascular anatomy. CONTRAST:  75mL OMNIPAQUE IOHEXOL 350 MG/ML SOLN COMPARISON:  CT abdomen pelvis September 25, 2018 FINDINGS: Cardiovascular: Preferential opacification of the thoracic aorta. The aortic root is suboptimally assessed given cardiac pulsation artifact on this nongated exam. The aorta is normal caliber. Shared origin of the brachiocephalic and left common carotid arteries. No intramural hematoma, dissection flap or other luminal abnormality of the aorta is seen. No periaortic stranding or hemorrhage. Normal heart size. No pericardial  effusion. Pulmonary arteries are normal caliber. Study is not tailored for the evaluation of pulmonary embolism though no large central filling defects are identified. Mediastinum/Nodes: Small wedge-shaped soft tissue attenuation in the anterior mediastinum/prevascular spaces most compatible with a small thymic remnant. No enlarged mediastinal or axillary lymph nodes. Thyroid gland, trachea, and esophagus demonstrate no significant findings. Lungs/Pleura: Atelectatic changes or scarring in the medial left lung base. No consolidation, features of edema, pneumothorax, or effusion. Upper Abdomen: No acute abnormalities present in the visualized portions of the upper abdomen. Musculoskeletal: No chest wall abnormality. No acute or significant osseous findings. Review of the MIP images confirms the above findings. IMPRESSION: 1. No evidence of acute aortic syndrome. 2. No acute intrathoracic process. 3. 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 Electronically Signed: By: Kreg ShropshirePrice  DeHay M.D. On: 11/25/2018 04:01   Dg Chest Port 1 View  Result Date: 11/25/2018 CLINICAL DATA:  Chest pain and palpitations. Shortness of breath. EXAM: PORTABLE CHEST 1 VIEW COMPARISON:  None. FINDINGS: The cardiomediastinal contours are normal. Mild bibasilar atelectasis. Pulmonary vasculature is normal. No consolidation, pleural effusion, or pneumothorax. No acute osseous abnormalities are seen. IMPRESSION: Mild bibasilar atelectasis. Electronically Signed  By: Narda RutherfordMelanie  Sanford M.D.   On: 11/25/2018 01:54      Procedures   ____________________________________________   INITIAL IMPRESSION / MDM / ASSESSMENT AND PLAN / ED COURSE  As part of my medical decision making, I reviewed the following data within the electronic MEDICAL RECORD NUMBER   28 year old female presented with above-stated history and physical exam secondary to chest discomfort.  Considered possibly of CAD EKG revealed no evidence of  ischemia or infarction or arrhythmia.  Also considered a possibility of a pulmonary emboli and as such d-dimer was performed which was abnormal.  CT angiogram of the chest was performed which revealed no evidence of pulmonary emboli report states studies not tailored for pulmonary emboli however I spoke to the radiologist who stated that study was appropriate and that no pulmonary emboli was noted.  Patient will be referred to her cardiologist for further outpatient evaluation. ____________________________________________  FINAL CLINICAL IMPRESSION(S) / ED DIAGNOSES  Final diagnoses:  Nonspecific chest pain     MEDICATIONS GIVEN DURING THIS VISIT:  Medications  iohexol (OMNIPAQUE) 350 MG/ML injection 75 mL (75 mLs Intravenous Contrast Given 11/25/18 0335)  potassium chloride SA (K-DUR) CR tablet 40 mEq (40 mEq Oral Given 11/25/18 0431)     ED Discharge Orders    None      *Please note:  Mandy FujitaDaisy Robertson was evaluated in Emergency Department on 11/25/2018 for the symptoms described in the history of present illness. She was evaluated in the context of the global COVID-19 pandemic, which necessitated consideration that the patient might be at risk for infection with the SARS-CoV-2 virus that causes COVID-19. Institutional protocols and algorithms that pertain to the evaluation of patients at risk for COVID-19 are in a state of rapid change based on information released by regulatory bodies including the CDC and federal and state organizations. These policies and algorithms were followed during the patient's care in the ED.  Some ED evaluations and interventions may be delayed as a result of limited staffing during the pandemic.*  Note:  This document was prepared using Dragon voice recognition software and may include unintentional dictation errors.   Darci CurrentBrown, [place] N, MD 11/25/18 [phone]

## 2019-01-26 ENCOUNTER — Other Ambulatory Visit: Payer: Self-pay

## 2019-01-26 ENCOUNTER — Encounter: Payer: Self-pay | Admitting: Emergency Medicine

## 2019-01-26 ENCOUNTER — Ambulatory Visit
Admission: EM | Admit: 2019-01-26 | Discharge: 2019-01-26 | Disposition: A | Discharge status: Home / Self Care | Payer: BLUE CROSS/BLUE SHIELD

## 2019-01-26 DIAGNOSIS — M545 Low back pain, unspecified: Secondary | ICD-10-CM

## 2019-01-26 DIAGNOSIS — R42 Dizziness and giddiness: Secondary | ICD-10-CM

## 2019-01-26 DIAGNOSIS — N898 Other specified noninflammatory disorders of vagina: Secondary | ICD-10-CM | POA: Diagnosis not present

## 2019-01-26 DIAGNOSIS — F419 Anxiety disorder, unspecified: Secondary | ICD-10-CM

## 2019-01-26 DIAGNOSIS — Z202 Contact with and (suspected) exposure to infections with a predominantly sexual mode of transmission: Secondary | ICD-10-CM

## 2019-01-26 HISTORY — DX: Anxiety disorder, unspecified: F41.9

## 2019-01-26 HISTORY — DX: Tachycardia, unspecified: R00.0

## 2019-01-26 LAB — URINALYSIS, COMPLETE (UACMP) WITH MICROSCOPIC
Bilirubin Urine: NEGATIVE
Glucose, UA: NEGATIVE mg/dL
Hgb urine dipstick: NEGATIVE
Ketones, ur: NEGATIVE mg/dL
Leukocytes,Ua: NEGATIVE
Nitrite: NEGATIVE
Protein, ur: NEGATIVE mg/dL
Specific Gravity, Urine: 1.025 (ref 1.005–1.030)
pH: 6.5 (ref 5.0–8.0)

## 2019-01-26 LAB — WET PREP, GENITAL
Clue Cells Wet Prep HPF POC: NONE SEEN
Sperm: NONE SEEN
Trich, Wet Prep: NONE SEEN
Yeast Wet Prep HPF POC: NONE SEEN

## 2019-01-26 MED ORDER — METRONIDAZOLE 500 MG PO TABS: 500.0000 mg | ORAL_TABLET | Freq: Two times a day (BID) | ORAL | 0 refills | Status: DC

## 2019-01-26 MED ORDER — MECLIZINE HCL 25 MG PO TABS: 25.0000 mg | ORAL_TABLET | Freq: Three times a day (TID) | ORAL | 0 refills | Status: DC | PRN

## 2019-01-26 NOTE — ED Provider Notes (Signed)
Mandy Robertson,    Name: Mandy Robertson DOB: Feb 01, 1991 MRN: [id number] CSN: [id number] PCP: Services, [facility]  Arrival date and time:  01/26/19 1710  Chief Complaint:  Dizziness, Back Pain, and Vaginal Discharge   NOTE: Prior to seeing the patient today, I have reviewed the triage nursing documentation and vital signs. Clinical staff has updated patient's PMH/PSHx, current medication list, and drug allergies/intolerances to ensure comprehensive history available to assist in medical decision making.   History:   HPI: Mandy Robertson is a 28 y.o. female who presents today with multiple medical complaints.  Patient advising that she has been intermittently lightheaded for the last 2 weeks.  She notes that she experiences vertiginous symptoms associated with position changes.  Patient states, "when I am stand up it feels like someone is squeezing my head for a minute and then it goes away".  Patient denies any associated headaches, weakness, and visual changes.    Additionally, patient also has ongoing RIGHT lower back pain that does not radiate into her lower extremity.  She has been treated here in the past for the same, however notes that her pain persists.  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 Patient is not experiencing any associated urinary symptoms; no dysuria, frequency, urgency.  She describes lower abdominal cramping. Patient denies any vaginal/pelvic pain or bleeding.  She does note copious amounts of vaginal discharge that she describes as being "thick, white, and milky". She notes that discharge is malodorous. Patient was seen by her PCP on 01/23/2019, at which time a UA and wet prep was performed that revealed negative results. Patient has been advised to follow up with GYN by her PCP, however she has not done so at this point.   Patient has a PMH (+) for anxiety. She reports  intermittent palpitations, however denies chest pain. She has been prescribed sertraline 50 mg daily, however patient notes that she is only taking this medication on an as-needed basis.  Patient has been seen in consult by recently by several specialists.  She reports that she has been seen by rheumatology for a SLE work-up and found to have a (+) ANA; pending follow up visit. She was also seen by cardiology for evaluation of her dizziness and intermittent tachycardia. Patient was started on diltiazem for palpitations per her report. She verbalizes frustration because no one can tell her what is wrong with her.  Past Medical History:  Diagnosis Date   Anxiety    Tachycardia     Past Surgical History:  Procedure Laterality Date   ABDOMINAL HYSTERECTOMY      Family History  Problem Relation Age of Onset   Healthy Mother    Healthy Father    Diabetes Paternal Uncle     Social History   Tobacco Use   Smoking status: Never Smoker   Smokeless tobacco: Never Used  Substance Use Topics   Alcohol use: Yes    Comment: social   Drug use: Not Currently    There are no active problems to display for this patient.   Home Medications:    Current Meds  Medication Sig   cyclobenzaprine (FLEXERIL) 10 MG tablet Take by mouth.   diltiazem (CARDIZEM) 30 MG tablet Take by mouth.   sertraline (ZOLOFT) 50 MG tablet    [DISCONTINUED] sertraline (ZOLOFT) 25 MG tablet Take by mouth.    Allergies:   Patient has no known  allergies.  Review of Systems (ROS): Review of Systems  Constitutional: Negative for chills and fever.  HENT: Negative for congestion, ear pain, sinus pressure, sinus pain, sore throat, tinnitus and trouble swallowing.   Eyes: Negative for pain and visual disturbance.  Respiratory: Negative for cough and shortness of breath.   Cardiovascular: Positive for palpitations (intermittent; on diltiazem). Negative for chest pain.  Gastrointestinal: Positive for  abdominal pain (cramping). Negative for diarrhea, nausea and vomiting.  Genitourinary: Positive for vaginal discharge. Negative for decreased urine volume, dysuria, flank pain, frequency, genital sores, hematuria, menstrual problem, pelvic pain, urgency, vaginal bleeding and vaginal pain.  Musculoskeletal: Positive for back pain. Negative for arthralgias, myalgias and neck pain.  Skin: Negative for color change, pallor and rash.  Neurological: Positive for dizziness and light-headedness. Negative for syncope and weakness.  Psychiatric/Behavioral: Negative for sleep disturbance. The patient is nervous/anxious.   All other systems reviewed and are negative.    Vital Signs: Today's Vitals   01/26/19 1719 01/26/19 1720 01/26/19 1833  BP: 123/88    Pulse: 88    Resp: 18    Temp: 98.1 F (36.7 C)    TempSrc: Oral    SpO2: 100%    Weight:  179 lb (81.2 kg)   Height:  5\' 4"  (1.626 m)   PainSc:  5  5     Physical Exam: Physical Exam  Constitutional: She is oriented to person, place, and time and well-developed, well-nourished, and in no distress. No distress.  HENT:  Head: Normocephalic and atraumatic.  Nose: Nose normal.  Mouth/Throat: Oropharynx is clear and moist.  Eyes: Pupils are equal, round, and reactive to light. Conjunctivae and EOM are normal.  Neck: Normal range of motion. Neck supple.  Cardiovascular: Normal rate, regular rhythm, normal heart sounds and intact distal pulses. Exam reveals no gallop and no friction rub.  No murmur heard. Pulmonary/Chest: Effort normal. No respiratory distress. She has no decreased breath sounds. She has no wheezes. She has no rhonchi. She has no rales.  Abdominal: Soft. Normal appearance and bowel sounds are normal. She exhibits no distension. There is no hepatosplenomegaly. There is abdominal tenderness in the suprapubic area. There is no CVA tenderness.  Genitourinary:    Genitourinary Comments: Exam deferred. No vaginal/pelvic pain or  bleeding. Patient is not currently pregnant. She has elected to self collect specimen swab for wet prep and DNA probe for GC.   Musculoskeletal: Normal range of motion.     Lumbar back: She exhibits pain (see marked location). She exhibits normal range of motion, no tenderness, no swelling and no spasm.       Back:     Comments: No midline pain or gross deformity  Neurological: She is alert and oriented to person, place, and time. Gait normal.  Skin: Skin is warm and dry. No rash noted. She is not diaphoretic.  Psychiatric: Memory, affect and judgment normal. Her mood appears anxious.  Nursing note and vitals reviewed.   Urgent Care Treatments / Results:   LABS: PLEASE NOTE: all labs that were ordered this encounter are listed, however only abnormal results are displayed. Labs Reviewed  WET PREP, GENITAL - Abnormal; Notable for the following components:      Result Value   WBC, Wet Prep HPF POC MODERATE (*)    All other components within normal limits  URINALYSIS, COMPLETE (UACMP) WITH MICROSCOPIC - Abnormal; Notable for the following components:   Bacteria, UA MANY (*)    All other components within normal  limits  GC/CHLAMYDIA PROBE AMP  URINE CULTURE    EKG: -None  RADIOLOGY: No results found.  PROCEDURES: Procedures  MEDICATIONS RECEIVED THIS VISIT: Medications - No data to display  PERTINENT CLINICAL COURSE NOTES/UPDATES:   Initial Impression / Assessment and Plan / Urgent Care Course:  Pertinent labs & imaging results that were available during my care of the patient were personally reviewed by me and considered in my medical decision making (see lab/imaging section of note for values and interpretations).  Paulita FujitaDaisy Bowker is a 28 y.o. female who [facility] with complaints of Dizziness, Back Pain, and Vaginal Discharge   Patient is well appearing overall in clinic today. She does not appear to be in any acute distress. Presenting symptoms  (see HPI) and exam as documented above. Patient presents with multiple medical complaints. She has been seen by several specialists and presents frustrated because "no one can tell her what is wrong". Will address presenting issues as follows:   Anxiety: This is a significant issue for this patient. Etiology seems to be multifactorial and related to home/family, school, and concerns about personal health. Patient has been prescribed an SSRI (sertraline), however I learned today that she is only taking it on a PRN basis. She was initially unclear about the dose. EMR had 25 mg listed, however patient advised that her PCP had increased to 50 mg. Long discussion with patient about the proper use of this medication. She was advised that in order to achieve maximum therapeutic benefits, the 50 mg dose needs to be taken on a daily basis. She states, "oh I didn't know I had to take it everyday". Encouraged to start 50 mg dose as prescribed. Briefly discussed stress reduction techniques.    Dizziness: Etiology of her dizziness unclear at this point. Discussed likely multi-factorial and related to anxiety, hormones, nutritional/fluid intake, and quite possible some of the medications that she is currently taking. Encouraged patient to ensure that she was maintaining adequate hydration. Will add meclizine 25 mg TID PRN dizziness. Indications and side effects of this medication reviewed.    Back pain: Likely simple musculoskeletal etiology. Patient walks/stands a lot of school. Encouraged APAP and/or IBU as needed. She was encouraged to apply heat TID-QID for at least 15-20 minutes at a time. Discussed stretching. There is no CVAT on exam. Doubt urinary correlation. UA today showed no LE or nitrites, however there was some mild pyuria (6-10 WBC/hpf) and bacteriuria. Will send provided urine sample for C&S.   Vaginal discharge: This seems to be the most distressing symptom for the patient. She notes copious amounts  of discharge that is malodorous. She feels self conscious due to the associated odor. Wet prep repeated today that revealed no evidence of clue cells, however there were moderate WBC/hpf. This seems to have increased since the swab collected by PCP a few days ago that demonstrated only "rare" WBCs. Patient endorses unprotected sexual activity. Discussed concerns with attending physician Adriana Simas(Cook, DO). Given her discharge and odor, recommendations were to proceed with treatment for BV.   Will treat with a 7 day course of oral metronidazole. Patient encouraged to complete the entire course of antibiotics even if she begins to feel better. Educated on need to avoid all ETOH while she is taking this medication in order to prevent a disulfiram like reaction that will result in significant nausea and vomiting. Patient encouraged to increase her fluid intake as much as possible. Discussed that water is always best to  flush the urinary tract and prevent development of a urinary tract infection while on treatment for the BV.  Will also send DNA probe for GC testing today. Patient to be contacted with further treatment directives should her testing result as positive.   Patient encouraged to follow up with her GYN as recommended by her PCP for ongoing evaluation and management if not improving with the aforementioned interventions.   Discussed follow up with primary care physician in 1 week for re-evaluation. I have reviewed the follow up and strict return precautions for any new or worsening symptoms. Patient is aware of symptoms that would be deemed urgent/emergent, and would thus require further evaluation either here or in the emergency department. At the time of discharge, she verbalized understanding and consent with the discharge plan as it was reviewed with her. All questions were fielded by provider and/or clinic staff prior to patient discharge.    Final Clinical Impressions / Urgent Care Diagnoses:   Final  diagnoses:  Vaginal discharge  Dizziness  Anxiety  Possible exposure to STD  Right-sided low back pain without sciatica, unspecified chronicity    New Prescriptions:  [place] Controlled Substance Registry consulted? Not Applicable  Meds ordered this encounter  Medications   metroNIDAZOLE (FLAGYL) 500 MG tablet    Sig: Take 1 tablet (500 mg total) by mouth 2 (two) times daily.    Dispense:  14 tablet    Refill:  0   meclizine (ANTIVERT) 25 MG tablet    Sig: Take 1 tablet (25 mg total) by mouth 3 (three) times daily as needed for dizziness.    Dispense:  30 tablet    Refill:  0    Recommended Follow up Care:  Patient encouraged to follow up with the following provider within the specified time frame, or sooner as dictated by the severity of her symptoms. As always, she was instructed that for any urgent/emergent care needs, she should seek care either here or in the emergency department for more immediate evaluation.  Follow-up Information    Services, [facility] In 1 week.   Why: General reassessment of symptoms if not improving Contact information: [address] [place] [address] [phone]         NOTE: This note was prepared using Dragon dictation software along with smaller phrase technology. Despite my best ability to proofread, there is the potential that transcriptional errors may still occur from this process, and are completely unintentional.    Karen Kitchens, NP 01/27/19 2258

## 2019-01-26 NOTE — ED Triage Notes (Addendum)
Patient in today c/o light headedness x 2 weeks off & on. Patient also c/o back pain x 1 month Patient saw PCP on 01/23/19 and gave urine specimen and vaginal swab which were both negative. No injury noted. Patient also states that she had blood work with rheumatology ~1 month ago to rule out Lupus. Patient had cardiology workup ~2 months ago for tachycardia. Work up was negative.

## 2019-01-26 NOTE — Discharge Instructions (Addendum)
It was very nice seeing you today in clinic. Thank you for entrusting me with your care.   Please utilize the medications that we discussed. Your prescriptions has been called in to your pharmacy.   Make arrangements to follow up with your regular doctor in 1 week for re-evaluation if not improving. If your symptoms/condition worsens, please seek follow up care either here or in the ER. Please remember, our [place] providers are "right here with you" when you need us.   Again, it was my pleasure to take care of you today. Thank you for choosing our clinic. I hope that you start to feel better quickly.   Floride Hutmacher, MSN, APRN, FNP-C, CEN Advanced Practice Provider [facility] 

## 2019-01-27 LAB — URINE CULTURE: Culture: <10000 — AB

## 2019-02-02 ENCOUNTER — Telehealth (HOSPITAL_COMMUNITY): Payer: Self-pay | Admitting: Emergency Medicine

## 2019-02-02 LAB — GC/CHLAMYDIA PROBE AMP

## 2019-02-02 NOTE — Telephone Encounter (Signed)
Reviewed labs, note states "interfering substance present", test was not able to be resulted. Contacted the patient to inform her and let her know since she was not pretreated, she is welcome to return to the clinic for recollection. Pt verbalized understanding, all questions answered.

## 2021-04-23 ENCOUNTER — Ambulatory Visit
Admission: EM | Admit: 2021-04-23 | Discharge: 2021-04-23 | Disposition: A | Discharge status: Home / Self Care | Payer: Medicaid Other | Attending: Emergency Medicine | Admitting: Emergency Medicine

## 2021-04-23 ENCOUNTER — Other Ambulatory Visit: Payer: Self-pay

## 2021-04-23 DIAGNOSIS — N760 Acute vaginitis: Secondary | ICD-10-CM | POA: Diagnosis present

## 2021-04-23 DIAGNOSIS — B9689 Other specified bacterial agents as the cause of diseases classified elsewhere: Secondary | ICD-10-CM | POA: Diagnosis present

## 2021-04-23 DIAGNOSIS — R1032 Left lower quadrant pain: Secondary | ICD-10-CM | POA: Diagnosis present

## 2021-04-23 DIAGNOSIS — M5442 Lumbago with sciatica, left side: Secondary | ICD-10-CM | POA: Insufficient documentation

## 2021-04-23 LAB — URINALYSIS, COMPLETE (UACMP) WITH MICROSCOPIC
Bacteria, UA: NONE SEEN
Bilirubin Urine: NEGATIVE
Glucose, UA: NEGATIVE mg/dL
Ketones, ur: 15 mg/dL — AB
Leukocytes,Ua: NEGATIVE
Nitrite: NEGATIVE
Protein, ur: NEGATIVE mg/dL
Specific Gravity, Urine: 1.025 (ref 1.005–1.030)
pH: 5 (ref 5.0–8.0)

## 2021-04-23 MED ORDER — FLUCONAZOLE 200 MG PO TABS: 200.0000 mg | ORAL_TABLET | Freq: Every day | ORAL | 0 refills | Status: AC

## 2021-04-23 MED ORDER — METRONIDAZOLE 500 MG PO TABS: 500.0000 mg | ORAL_TABLET | Freq: Two times a day (BID) | ORAL | 0 refills | Status: DC

## 2021-04-23 NOTE — ED Triage Notes (Addendum)
Pt reports hip pain x 1 week; chills,  lower back pain and left leg pain since this morning. Pt reports back pain history.   Pt reports she saw blood in the underwear and toilet paper 5 min ago when she was doing the urine sample.

## 2021-04-23 NOTE — ED Provider Notes (Signed)
[facility]    CSN: [id number] Arrival date & time: 04/23/21  1155      History   Chief Complaint Chief Complaint  Patient presents with   Back Pain    HPI Mandy Robertson is a 31 y.o. female.   Patient is here with abdominal pain left-sided flank pain that is radiating down her left leg.  She is having vaginal discharge white thick foul odor.  Patient was recently treated with clindamycin vaginal gel for BV and symptoms never completely went away.  Patient states she just generally feels bad..  Patient has had a partial hysterectomy but has noticed vaginal bleeding when wiping and going to the bathroom.   Past Medical History:  Diagnosis Date   Anxiety    Tachycardia     There are no problems to display for this patient.   Past Surgical History:  Procedure Laterality Date   ABDOMINAL HYSTERECTOMY      OB History   No obstetric history on file.      Home Medications    Prior to Admission medications   Medication Sig Start Date End Date Taking? Authorizing Provider  fluconazole (DIFLUCAN) 200 MG tablet Take 1 tablet (200 mg total) by mouth daily for 7 days. 04/23/21 04/30/21 Yes Marney Setting, NP  cyclobenzaprine (FLEXERIL) 10 MG tablet Take by mouth. 11/25/18   [provider]  diltiazem (CARDIZEM) 30 MG tablet Take by mouth. 12/02/18 12/02/19  [provider]  meclizine (ANTIVERT) 25 MG tablet Take 1 tablet (25 mg total) by mouth 3 (three) times daily as needed for dizziness. 01/26/19   Karen Kitchens, NP  metroNIDAZOLE (FLAGYL) 500 MG tablet Take 1 tablet (500 mg total) by mouth 2 (two) times daily. 04/23/21   Marney Setting, NP  sertraline (ZOLOFT) 50 MG tablet  09/30/18   [provider]    Family History Family History  Problem Relation Age of Onset   Healthy Mother    Healthy Father    Diabetes Paternal Uncle     Social History Social History   Tobacco Use   Smoking status: Never   Smokeless tobacco:  Never  Vaping Use   Vaping Use: Never used  Substance Use Topics   Alcohol use: Yes    Comment: social   Drug use: Not Currently     Allergies   Patient has no known allergies.   Review of Systems Review of Systems  Constitutional:  Negative for chills and fever.  HENT: Negative.    Respiratory: Negative.    Cardiovascular: Negative.   Gastrointestinal:  Positive for abdominal pain and nausea. Negative for diarrhea and vomiting.  Genitourinary:  Positive for flank pain, hematuria and vaginal discharge. Negative for vaginal pain.  Musculoskeletal:        Left lower back flank pain  Neurological:        Feels pain in lower back with radiation to left leg    Physical Exam Triage Vital Signs ED Triage Vitals  Enc Vitals Group     BP 04/23/21 1246 138/72     Pulse Rate 04/23/21 1246 (!) 105     Resp 04/23/21 1246 16     Temp 04/23/21 1246 99.8 F (37.7 C)     Temp Source 04/23/21 1246 Oral     SpO2 04/23/21 1246 99 %     Weight --      Height --      Head Circumference --  Peak Flow --      Pain Score 04/23/21 1245 10     Pain Loc --      Pain Edu? --      Excl. in [place]? --    No data found.  Updated Vital Signs BP 138/72 (BP Location: Left Arm)    Pulse (!) 105    Temp 99.8 F (37.7 C) (Oral)    Resp 16    SpO2 99%   Visual Acuity Right Eye Distance:   Left Eye Distance:   Bilateral Distance:    Right Eye Near:   Left Eye Near:    Bilateral Near:     Physical Exam Constitutional:      Appearance: Normal appearance.  Cardiovascular:     Rate and Rhythm: Tachycardia present.     Pulses: Normal pulses.  Pulmonary:     Effort: Pulmonary effort is normal.  Abdominal:     General: Bowel sounds are normal.     Palpations: Abdomen is soft.     Tenderness: There is abdominal tenderness. There is left CVA tenderness. There is no right CVA tenderness.     Comments: Tenderness to lower abdomen upon palpation  Musculoskeletal:        General: Normal range  of motion.  Skin:    General: Skin is warm.  Neurological:     General: No focal deficit present.     Mental Status: She is alert.     UC Treatments / Results  Labs (all labs ordered are listed, but only abnormal results are displayed) Labs Reviewed  URINALYSIS, COMPLETE (UACMP) WITH MICROSCOPIC - Abnormal; Notable for the following components:      Result Value   Hgb urine dipstick TRACE (*)    Ketones, ur 15 (*)    All other components within normal limits  CERVICOVAGINAL ANCILLARY ONLY    EKG   Radiology No results found.  Procedures Procedures (including critical care time)  Medications Ordered in UC Medications - No data to display  Initial Impression / Assessment and Plan / UC Course  I have reviewed the triage vital signs and the nursing notes.  Pertinent labs & imaging results that were available during my care of the patient were reviewed by me and considered in my medical decision making (see chart for details).     We will treat BV with oral medications versus transvaginal that she had prior We will send off for further testing and rule out STIs.  We will call the patient with any positive results and treatment. Patient is to take Tylenol and Motrin as needed for pain If symptoms become worse or you have a fever in the next 24 hours you will need to be seen in the emergency room for further testing and treatment. Your urine did not show any bacteria or UTI Final Clinical Impressions(s) / UC Diagnoses   Final diagnoses:  Acute left-sided low back pain with left-sided sciatica  Bacterial vaginosis  Left lower quadrant abdominal pain     Discharge Instructions      We will treat BV with oral medications versus transvaginal that she had prior We will send off for further testing and rule out STIs.  We will call the patient with any positive results and treatment. Patient is to take Tylenol and Motrin as needed for pain If symptoms become worse or you  have a fever in the next 24 hours you will need to be seen in the emergency room for further  testing and treatment. Your urine did not show any bacteria or UTI     ED Prescriptions     Medication Sig Dispense Auth. Provider   metroNIDAZOLE (FLAGYL) 500 MG tablet Take 1 tablet (500 mg total) by mouth 2 (two) times daily. 14 tablet Morley Kos L, NP   fluconazole (DIFLUCAN) 200 MG tablet Take 1 tablet (200 mg total) by mouth daily for 7 days. 7 tablet Marney Setting, NP      PDMP not reviewed this encounter.   Marney Setting, NP 04/23/21 1351

## 2021-04-23 NOTE — Discharge Instructions (Addendum)
We will treat BV with oral medications versus transvaginal that she had prior We will send off for further testing and rule out STIs.  We will call the patient with any positive results and treatment. Patient is to take Tylenol and Motrin as needed for pain If symptoms become worse or you have a fever in the next 24 hours you will need to be seen in the emergency room for further testing and treatment. Your urine did not show any bacteria or UTI

## 2021-04-24 LAB — CERVICOVAGINAL ANCILLARY ONLY
Bacterial Vaginitis (gardnerella): NEGATIVE
Candida Glabrata: NEGATIVE
Candida Vaginitis: POSITIVE — AB
Chlamydia: NEGATIVE
Comment: NEGATIVE
Comment: NEGATIVE
Comment: NEGATIVE
Comment: NEGATIVE
Comment: NEGATIVE
Comment: NORMAL
Neisseria Gonorrhea: NEGATIVE
Trichomonas: NEGATIVE

## 2021-04-24 IMAGING — CT CT ANGIOGRAPHY CHEST
2 of 6 series · 17 of 46 positions shown · IV contrast (omnipaque)
Comparison: CT abdomen pelvis September 25, 2018
COMPARISON: CT abdomen pelvis September 25, 2018

Addendum:
CLINICAL DATA: Chest pain, complex, no cardiac history

EXAM:
CT ANGIOGRAPHY CHEST WITH CONTRAST
TECHNIQUE: Multidetector CT imaging of the chest was performed using the
standard protocol during bolus administration of intravenous
contrast. Multiplanar CT image reconstructions and MIPs were
obtained to evaluate the vascular anatomy.
CONTRAST:  75mL OMNIPAQUE IOHEXOL 350 MG/ML SOLN

[Series 5: thins · axial · 0.61mm/px · z∈[-492,-240]mm · 14 of 278 slices shown]
[im 13/278  lung]
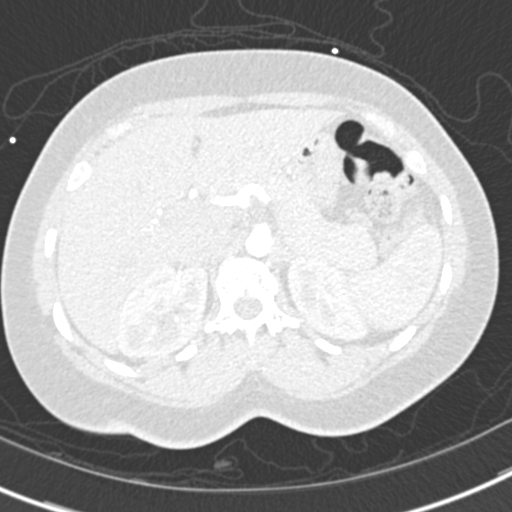
[im 37/278  soft-tissue]
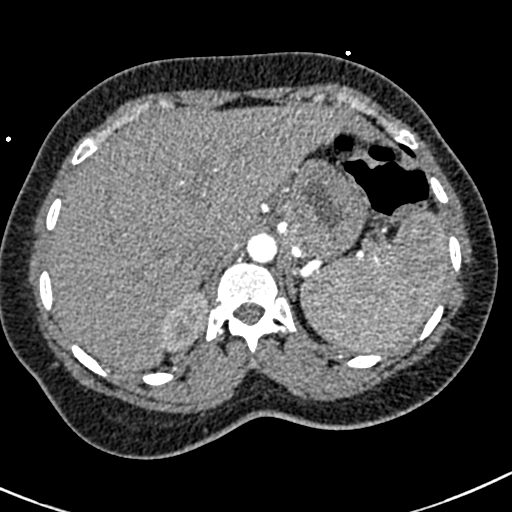
[im 49/278  lung]
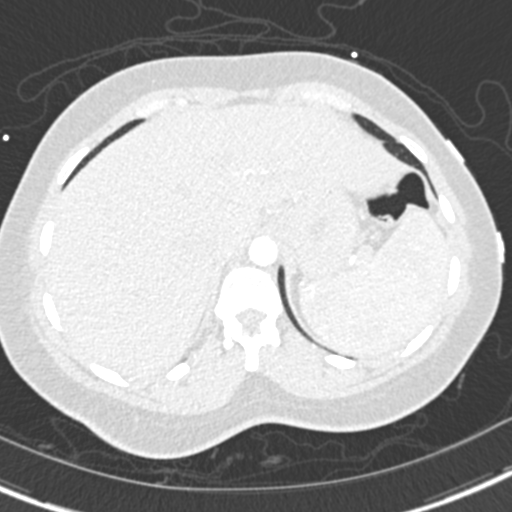
[im 73/278  soft-tissue]
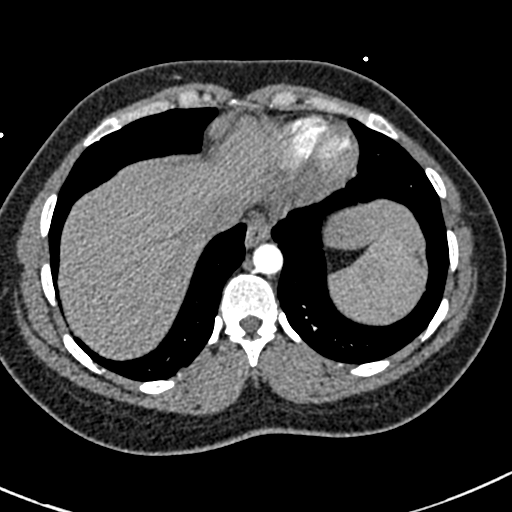
[im 97/278  lung]
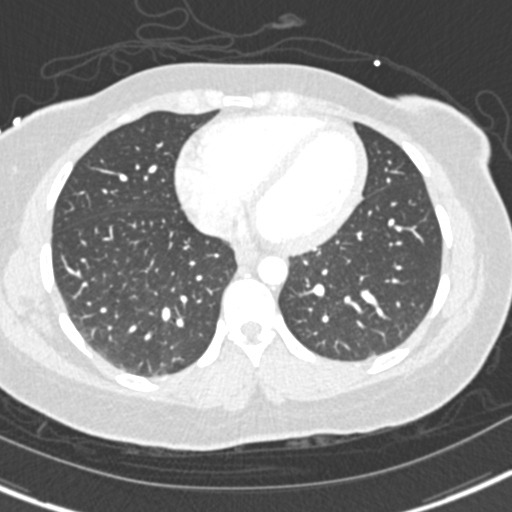
[im 109/278  soft-tissue]
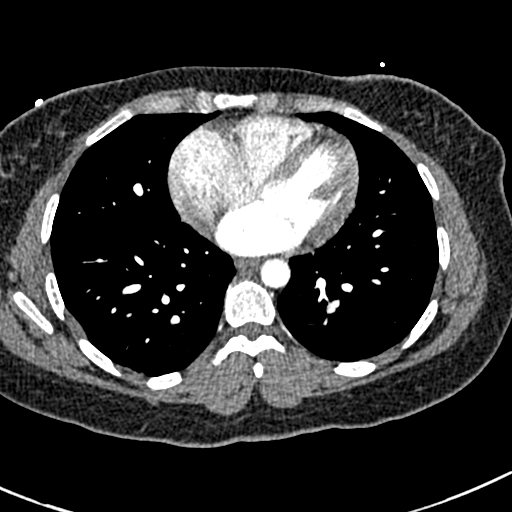
[im 133/278  lung]
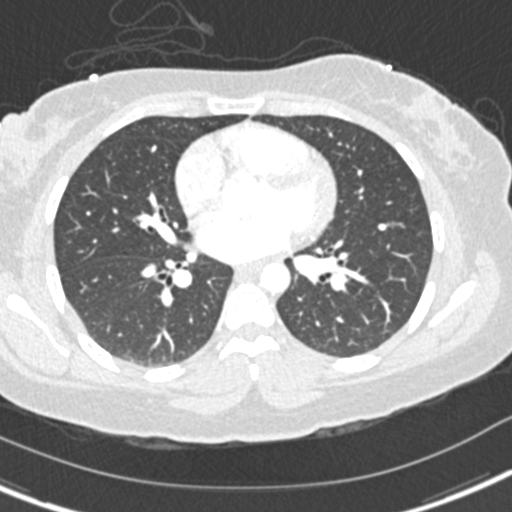
[im 145/278  soft-tissue]
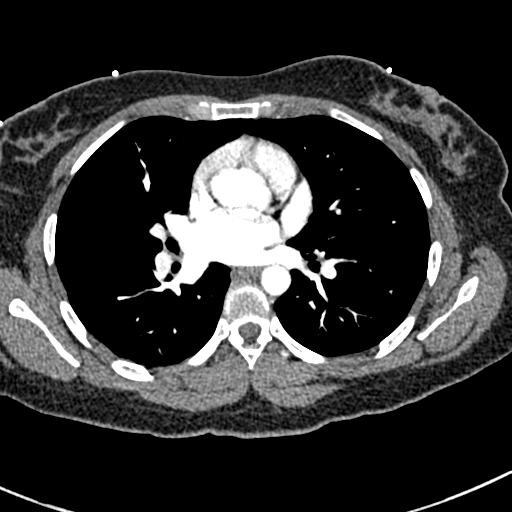
[im 169/278  lung]
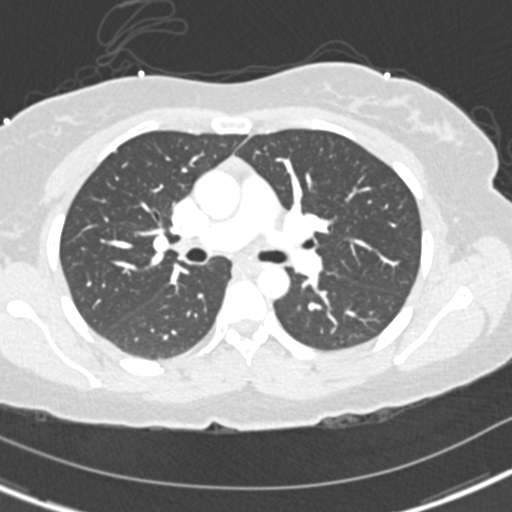
[im 181/278  soft-tissue]
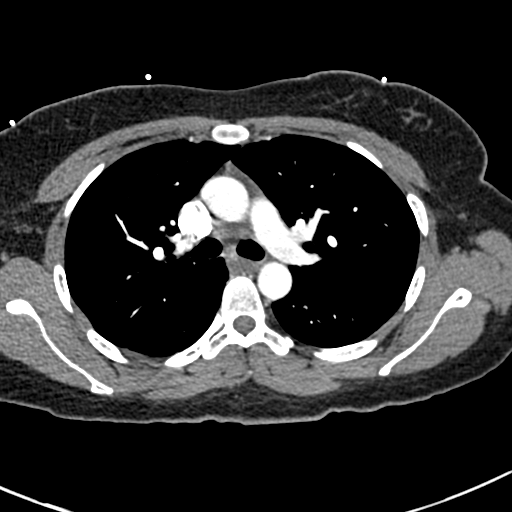
[im 205/278  lung]
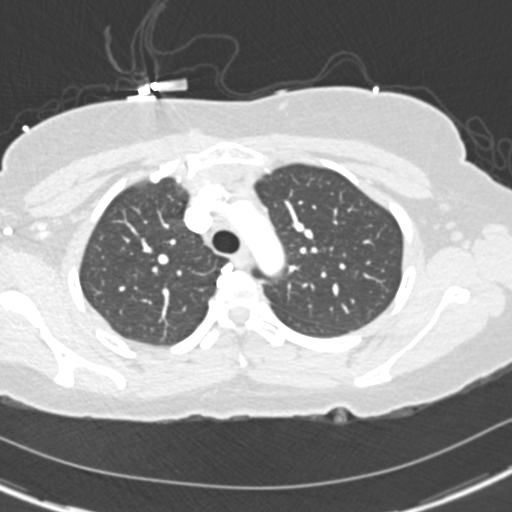
[im 229/278  soft-tissue]
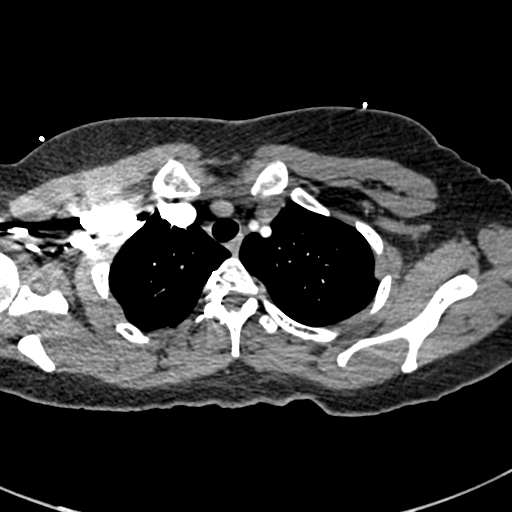
[im 241/278  lung]
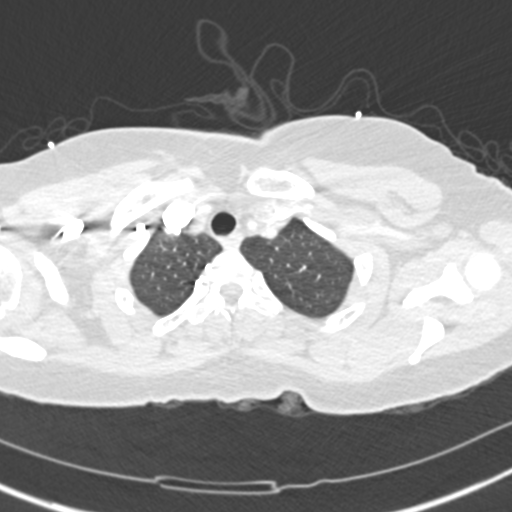
[im 265/278  soft-tissue]
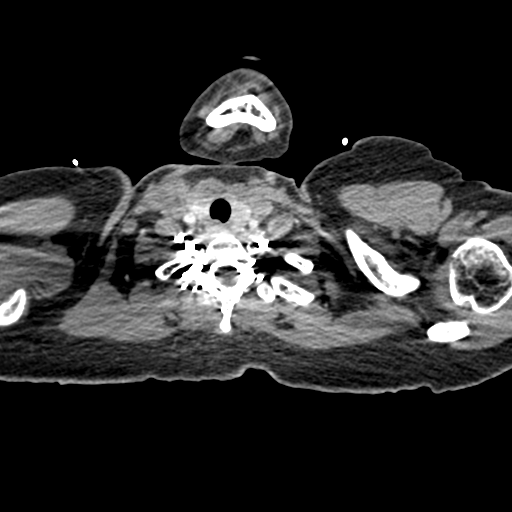

[Series 7: coronal mpr · coronal · 0.54mm/px · 3 of 81 slices shown]
[im 21/81  soft-tissue]
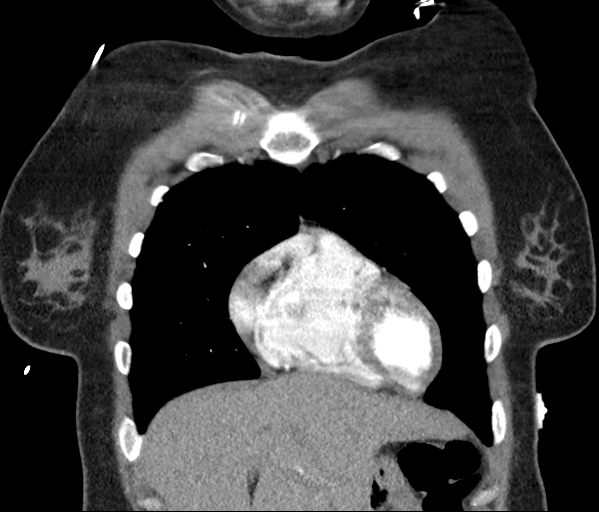
[im 41/81  soft-tissue]
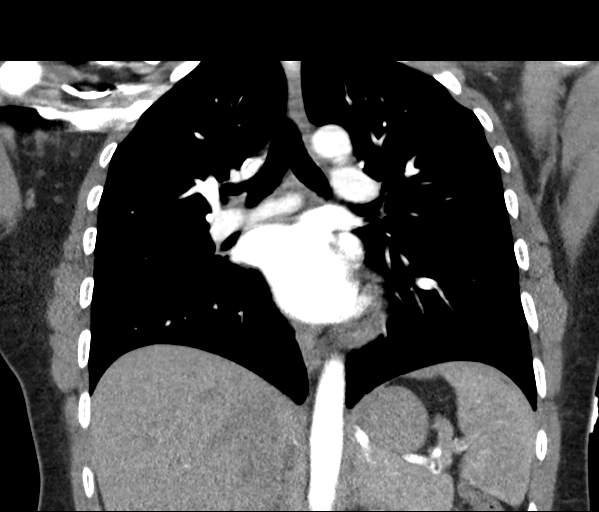
[im 61/81  soft-tissue]
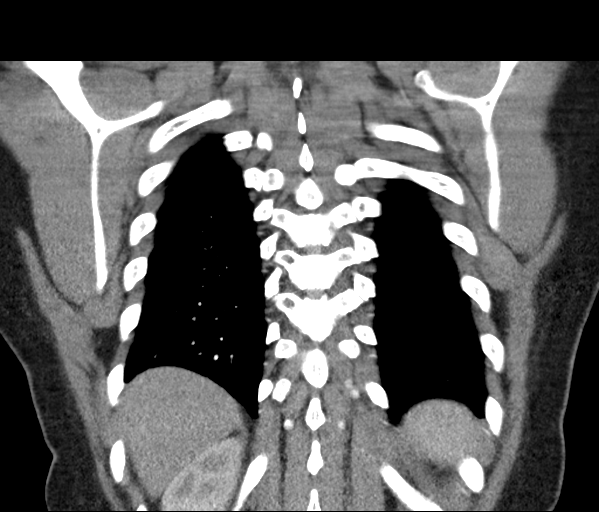

[17 of 46 positions shown; findings below may reference images not displayed]

FINDINGS: Cardiovascular: Preferential opacification of the thoracic aorta.
The aortic root is suboptimally assessed given cardiac pulsation
artifact on this nongated exam. The aorta is normal caliber. Shared
origin of the brachiocephalic and left common carotid arteries. No
intramural hematoma, dissection flap or other luminal abnormality of
the aorta is seen. No periaortic stranding or hemorrhage. Normal
heart size. No pericardial effusion. Pulmonary arteries are normal
caliber. Study is not tailored for the evaluation of pulmonary
embolism though no large central filling defects are identified.

Mediastinum/Nodes: Small wedge-shaped soft tissue attenuation in the
anterior mediastinum/prevascular spaces most compatible with a small
thymic remnant. No enlarged mediastinal or axillary lymph nodes.
Thyroid gland, trachea, and esophagus demonstrate no significant
findings.

Lungs/Pleura: Atelectatic changes or scarring in the medial left
lung base. No consolidation, features of edema, pneumothorax, or
effusion.

Upper Abdomen: No acute abnormalities present in the visualized
portions of the upper abdomen.

Musculoskeletal: No chest wall abnormality. No acute or significant
osseous findings.

Review of the MIP images confirms the above findings.
IMPRESSION: 1. No evidence of acute aortic syndrome.
2. No acute intrathoracic process.
3. Wedge-shaped anterior mediastinal soft tissue, likely thymic
remnant.

ADDENDUM:
While there is significant opacification of the thoracic aorta,
there does appear to be satisfactory opacification of the pulmonary
arteries. No definite evidence of pulmonary emboli is noted.

*** End of Addendum ***
FINDINGS: Cardiovascular: Preferential opacification of the thoracic aorta.
The aortic root is suboptimally assessed given cardiac pulsation
artifact on this nongated exam. The aorta is normal caliber. Shared
origin of the brachiocephalic and left common carotid arteries. No
intramural hematoma, dissection flap or other luminal abnormality of
the aorta is seen. No periaortic stranding or hemorrhage. Normal
heart size. No pericardial effusion. Pulmonary arteries are normal
caliber. Study is not tailored for the evaluation of pulmonary
embolism though no large central filling defects are identified.

Mediastinum/Nodes: Small wedge-shaped soft tissue attenuation in the
anterior mediastinum/prevascular spaces most compatible with a small
thymic remnant. No enlarged mediastinal or axillary lymph nodes.
Thyroid gland, trachea, and esophagus demonstrate no significant
findings.

Lungs/Pleura: Atelectatic changes or scarring in the medial left
lung base. No consolidation, features of edema, pneumothorax, or
effusion.

Upper Abdomen: No acute abnormalities present in the visualized
portions of the upper abdomen.

Musculoskeletal: No chest wall abnormality. No acute or significant
osseous findings.

Review of the MIP images confirms the above findings.
IMPRESSION: 1. No evidence of acute aortic syndrome.
2. No acute intrathoracic process.
3. Wedge-shaped anterior mediastinal soft tissue, likely thymic
remnant.

## 2021-04-24 IMAGING — DX PORTABLE CHEST - 1 VIEW
1 series · 1 of 1 positions shown · non-contrast
Comparison: None.

CLINICAL DATA: Chest pain and palpitations. Shortness of breath.

EXAM:
PORTABLE CHEST 1 VIEW

[chest ap]
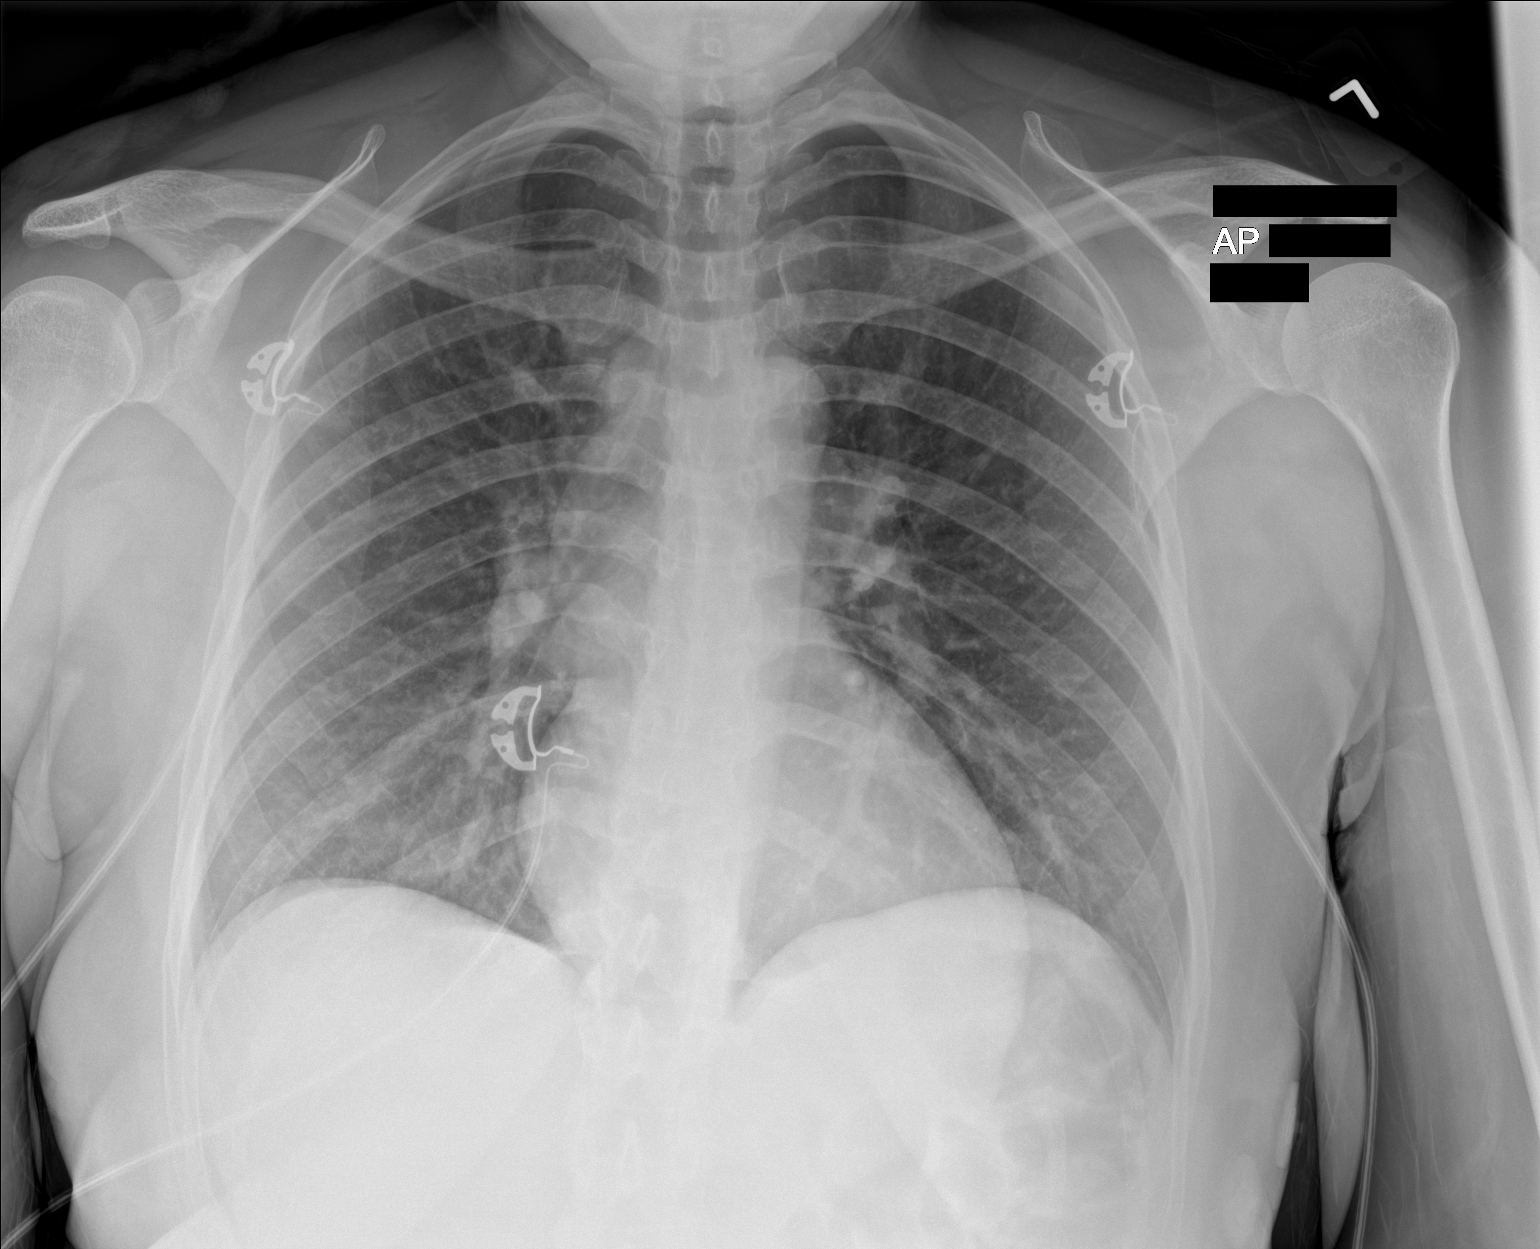

[1 of 1 positions shown; findings below may reference images not displayed]

FINDINGS: The cardiomediastinal contours are normal. Mild bibasilar
atelectasis. Pulmonary vasculature is normal. No consolidation,
pleural effusion, or pneumothorax. No acute osseous abnormalities
are seen.
IMPRESSION: Mild bibasilar atelectasis.

## 2021-05-30 ENCOUNTER — Other Ambulatory Visit: Payer: Self-pay

## 2021-05-30 ENCOUNTER — Ambulatory Visit
Admission: EM | Admit: 2021-05-30 | Discharge: 2021-05-30 | Disposition: A | Discharge status: Home / Self Care | Payer: Medicaid Other

## 2021-05-30 DIAGNOSIS — G8929 Other chronic pain: Secondary | ICD-10-CM | POA: Diagnosis not present

## 2021-05-30 DIAGNOSIS — M5442 Lumbago with sciatica, left side: Secondary | ICD-10-CM | POA: Diagnosis not present

## 2021-05-30 DIAGNOSIS — M5441 Lumbago with sciatica, right side: Secondary | ICD-10-CM

## 2021-05-30 DIAGNOSIS — M25552 Pain in left hip: Secondary | ICD-10-CM

## 2021-05-30 HISTORY — DX: Unspecified osteoarthritis, unspecified site: M19.90

## 2021-05-30 MED ORDER — KETOROLAC TROMETHAMINE 60 MG/2ML IM SOLN
60.0000 mg | Freq: Once | INTRAMUSCULAR | Status: AC
  Administered 2021-05-30: 60 mg via INTRAMUSCULAR

## 2021-05-30 MED ORDER — PREDNISONE 10 MG (21) PO TBPK: ORAL_TABLET | Freq: Every day | ORAL | 0 refills | Status: DC

## 2021-05-30 NOTE — ED Triage Notes (Signed)
Patient is here for "Left Hip Pain". Started "earlier in week". Can barely walk this morning. Seen for this by PCP/Specialist for this with recent MRI "arthritis". Pain is getting bad this morning. No other symptoms of concern. Next appt with Orthopaedics [phone].

## 2021-05-30 NOTE — ED Provider Notes (Signed)
[facility]    CSN: [id number] Arrival date & time: 05/30/21  0903      History   Chief Complaint Chief Complaint  Patient presents with   Hip Pain    HPI Mandy Robertson is a 31 y.o. female.   31 year old female accompanied by her Mom presents with worsening of left hip and back pain. Has history of chronic back and right hip pain since 2020. Has had chronic pelvic pain since 2019. Would have intermittent back pain starting in mid 2020 and would become more constant. She started experiencing more sciatica in 2021 and was referred to physical therapy which was not helpful. She recently had an MRI of her spine on 05/08/2021 which showed degenerative changes at L5-S1 with left disc protrusion affecting the left S1 nerve root. Also bilateral foraminal narrowing of L4-L5 and L5-S1. She has been referred to Pain Management at [facility] in which she has an appointment on June 10, 2021. Today the pain has become so severe she is having difficulty walking. She has tried Ibuprofen 800mg  with no relief. In the past, she has been on multiple anti-inflammatory medication and muscle relaxers with no relief. She does not recall taking Prednisone. She has had success in the past with Toradol injections and requests pain management today. Other chronic health issues include anxiety and uncertain what medication she may still be taking (our chart indicates Zoloft but recent records from [facility] ER indicate Lexapro). No pertinent family history.   The history is provided by the patient.   Past Medical History:  Diagnosis Date   Anxiety    Arthritis    Tachycardia     There are no problems to display for this patient.   Past Surgical History:  Procedure Laterality Date   ABDOMINAL HYSTERECTOMY      OB History   No obstetric history on file.      Home Medications    Prior to Admission medications   Medication Sig Start Date End Date Taking? Authorizing Provider  ibuprofen (ADVIL) 200 MG  tablet Take 800 mg by mouth every 6 (six) hours as needed. Last dose: 6 am. 05/30/21  Yes [provider]  predniSONE (STERAPRED UNI-PAK 21 TAB) 10 MG (21) TBPK tablet Take by mouth daily. Take 6 tabs by mouth once daily on day 1 then decrease by 1 tablet each day until finished. 05/30/21  Yes Katy Apo, NP  diltiazem (CARDIZEM) 30 MG tablet Take by mouth. 12/02/18 12/02/19  [provider]  sertraline (ZOLOFT) 50 MG tablet  09/30/18   [provider]    Family History Family History  Problem Relation Age of Onset   Healthy Mother    Healthy Father    Diabetes Paternal Uncle     Social History Social History   Tobacco Use   Smoking status: Never   Smokeless tobacco: Never  Vaping Use   Vaping Use: Never used  Substance Use Topics   Alcohol use: Yes    Comment: social   Drug use: Not Currently     Allergies   Patient has no known allergies.   Review of Systems Review of Systems  Constitutional:  Positive for activity change and fatigue. Negative for appetite change, chills and fever.  Respiratory:  Negative for chest tightness and shortness of breath.   Gastrointestinal:  Negative for constipation, nausea and vomiting.  Genitourinary:  Negative for decreased urine volume, difficulty urinating, flank pain and hematuria.  Musculoskeletal:  Positive for  arthralgias and back pain. Negative for neck pain and neck stiffness.  Skin:  Negative for color change and rash.  Allergic/Immunologic: Negative for environmental allergies, food allergies and immunocompromised state.  Neurological:  Positive for weakness and numbness. Negative for tremors, seizures, syncope, facial asymmetry, speech difficulty and light-headedness.  Hematological:  Negative for adenopathy. Does not bruise/bleed easily.    Physical Exam Triage Vital Signs ED Triage Vitals  Enc Vitals Group     BP 05/30/21 0939 114/83     Pulse Rate 05/30/21 0939 82     Resp 05/30/21 0939 18      Temp 05/30/21 0939 99.2 F (37.3 C)     Temp Source 05/30/21 0939 Oral     SpO2 05/30/21 0939 100 %     Weight 05/30/21 0937 170 lb (77.1 kg)     Height 05/30/21 0937 5\' 4"  (1.626 m)     Head Circumference --      Peak Flow --      Pain Score 05/30/21 0936 10     Pain Loc --      Pain Edu? --      Excl. in [place]? --    No data found.  Updated Vital Signs BP 114/83 (BP Location: Left Arm)    Pulse 82    Temp 99.2 F (37.3 C) (Oral)    Resp 18    Ht 5\' 4"  (1.626 m)    Wt 170 lb (77.1 kg)    SpO2 100%    BMI 29.18 kg/m   Visual Acuity Right Eye Distance:   Left Eye Distance:   Bilateral Distance:    Right Eye Near:   Left Eye Near:    Bilateral Near:     Physical Exam Vitals and nursing note reviewed.  Constitutional:      General: She is awake. She is not in acute distress.    Appearance: She is well-developed and well-groomed.     Comments: She is sitting in a wheelchair and in no acute distress but appears uncomfortable due to pain.  Most of exam performed with patient in wheelchair and standing.   HENT:     Head: Normocephalic and atraumatic.     Right Ear: Hearing normal.     Left Ear: Hearing normal.  Eyes:     Extraocular Movements: Extraocular movements intact.     Conjunctiva/sclera: Conjunctivae normal.  Cardiovascular:     Rate and Rhythm: Normal rate.  Pulmonary:     Effort: Pulmonary effort is normal.  Musculoskeletal:        General: Tenderness present.     Cervical back: Normal range of motion.     Thoracic back: Normal.     Lumbar back: Tenderness present. No swelling, edema, deformity or signs of trauma. Decreased range of motion. No scoliosis.       Back:     Right hip: Tenderness present. No deformity or bony tenderness. Decreased range of motion.     Left hip: Tenderness present. No deformity or bony tenderness. Decreased range of motion.       Legs:     Comments: Decreased range of motion of lower back, especially with flexion. Pain with  all movements. Tenderness present along lower lumbar area bilaterally. No distinct muscle spasms detected. Tenderness along lateral upper thigh bilaterally, left more than right. No rash or lesions. No distinct numbness. No neuro deficits noted. Good distal pulses and sensation.   Skin:    General: Skin is warm and  dry.     Capillary Refill: Capillary refill takes less than 2 seconds.     Findings: No bruising, erythema, lesion or rash.  Neurological:     General: No focal deficit present.     Mental Status: She is alert and oriented to person, place, and time.     Sensory: Sensation is intact. No sensory deficit.     Motor: Motor function is intact.     Comments: Can ambulate but painful.   Psychiatric:        Mood and Affect: Mood normal.        Behavior: Behavior normal. Behavior is cooperative.        Thought Content: Thought content normal.        Judgment: Judgment normal.     UC Treatments / Results  Labs (all labs ordered are listed, but only abnormal results are displayed) Labs Reviewed - No data to display  EKG   Radiology No results found.  Procedures Procedures (including critical care time)  Medications Ordered in UC Medications  ketorolac (TORADOL) injection 60 mg (60 mg Intramuscular Given 05/30/21 1157)    Initial Impression / Assessment and Plan / UC Course  I have reviewed the triage vital signs and the nursing notes.  Pertinent labs & imaging results that were available during my care of the patient were reviewed by me and considered in my medical decision making (see chart for details).    She does not appear to have had Toradol within the past 3 months.  Gave Toradol 60mg  IM today to help with pain. Recommend trial Prednisone 10mg  6 day dose pack as directed- this hopefully will help manage her pain until her next visit on June 10, 2021. Continue to apply warm moist heat to area for comfort. If pain gets worse or any numbness or loss of bladder or  bowel control occur, go to the ER ASAP. Otherwise, follow-up with her doctor/pain management on 3/7 as planned.  Final Clinical Impressions(s) / UC Diagnoses   Final diagnoses:  Left hip pain  Chronic bilateral low back pain with bilateral sciatica     Discharge Instructions      You were given a shot of Toradol today to help with pain and inflammation. Recommend start Prednisone 10mg  tablets- take 6 tablets today then decrease by 1 tablet each day until finished on day 6. Continue to apply warm moist heat to area for comfort. If pain gets worse or any numbness or loss of bladder or bowel control occur, go to the ER ASAP. Otherwise, follow-up with your doctor on 06/10/2021 as planned.     ED Prescriptions     Medication Sig Dispense Auth. Provider   predniSONE (STERAPRED UNI-PAK 21 TAB) 10 MG (21) TBPK tablet Take by mouth daily. Take 6 tabs by mouth once daily on day 1 then decrease by 1 tablet each day until finished. 21 tablet Natashia Roseman, Nicholes Stairs, NP      PDMP not reviewed this encounter.   Katy Apo, NP 05/31/21 [phone]

## 2021-05-30 NOTE — Discharge Instructions (Signed)
You were given a shot of Toradol today to help with pain and inflammation. Recommend start Prednisone 10mg  tablets- take 6 tablets today then decrease by 1 tablet each day until finished on day 6. Continue to apply warm moist heat to area for comfort. If pain gets worse or any numbness or loss of bladder or bowel control occur, go to the ER ASAP. Otherwise, follow-up with your doctor on 06/10/2021 as planned.

## 2022-10-17 ENCOUNTER — Other Ambulatory Visit: Payer: Self-pay

## 2022-10-17 ENCOUNTER — Ambulatory Visit
Admission: EM | Admit: 2022-10-17 | Discharge: 2022-10-17 | Disposition: A | Discharge status: Home / Self Care | Payer: Medicaid Other | Attending: Family Medicine | Admitting: Family Medicine

## 2022-10-17 ENCOUNTER — Ambulatory Visit (INDEPENDENT_AMBULATORY_CARE_PROVIDER_SITE_OTHER): Payer: Medicaid Other

## 2022-10-17 DIAGNOSIS — M549 Dorsalgia, unspecified: Secondary | ICD-10-CM

## 2022-10-17 DIAGNOSIS — M5417 Radiculopathy, lumbosacral region: Secondary | ICD-10-CM

## 2022-10-17 MED ORDER — PREDNISONE 10 MG (21) PO TBPK: ORAL_TABLET | Freq: Every day | ORAL | 0 refills | Status: DC

## 2022-10-17 MED ORDER — GABAPENTIN 100 MG PO CAPS: 100.0000 mg | ORAL_CAPSULE | Freq: Three times a day (TID) | ORAL | 0 refills | Status: DC | PRN

## 2022-10-17 MED ORDER — KETOROLAC TROMETHAMINE 60 MG/2ML IM SOLN
30.0000 mg | Freq: Once | INTRAMUSCULAR | Status: AC
  Administered 2022-10-17: 30 mg via INTRAMUSCULAR

## 2022-10-17 MED ORDER — METHOCARBAMOL 500 MG PO TABS: 500.0000 mg | ORAL_TABLET | Freq: Two times a day (BID) | ORAL | 0 refills | Status: DC

## 2022-10-17 NOTE — ED Triage Notes (Signed)
Patient presents to UC for back pain and hip pain since yesterday. States on-going issue from back injury in 2022. Has been seen by ortho and was told it is caused by pinched nerve. Also reports having intermittent shooting pain to right leg and left arm. Treating pain with tylenol and icy hot.

## 2022-10-17 NOTE — Discharge Instructions (Addendum)
Follow up with your pain specialist. You may need a repeat MRI.   If medication was prescribed, stop by the pharmacy to pick up your prescriptions.  For your back pain, Take 1500 mg Tylenol twice a day, take muscle relaxer (Robaxin) twice a day, take Gabapentin  as needed for pain. Consider stopping by the pharmacy or dollar store to pick up some Lidocaine patches. Apply for 12 hours and then remove.   Watch for worsening symptoms such as an increasing weakness or loss of sensation in your arms or legs, increasing pain and/or the loss of bladder or bowel function. Should any of these occur, go to the emergency department immediately.

## 2022-10-17 NOTE — ED Provider Notes (Signed)
[facility]    CSN: [id number] Arrival date & time: 10/17/22  0815      History   Chief Complaint Chief Complaint  Patient presents with   Back Pain   Hip Pain    HPI  HPI Mandy Robertson is a 32 y.o. female.   Mandy Robertson presents for intermittent low back pain that started last night but has ongoing issues with her hips and back.  She hasn't had pain since getting steroid shots in her back and hips until now.    Last night, started having pain that shoots down her leg and upward in her left arm.  She is having trouble sleeping. She needs to brace herself when the comes. Pain lasts 1-2 minutes then goes away and described as sharp. Had similar in the past but radiation pattern is new. Took Tylenol last night and applied some IcyHot but the pain came back.  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   Denies weakness, numbness, tingling, dysuria, leg weakness, abdominal pain, chest pain, incontinence, pelvic pain, perianal numbness. Continues to have  pain with movement.   Past Medical History:  Diagnosis Date   Anxiety    Arthritis    Tachycardia     There are no problems to display for this patient.   Past Surgical History:  Procedure Laterality Date   ABDOMINAL HYSTERECTOMY      OB History   No obstetric history on file.      Home Medications    Prior to Admission medications   Medication Sig Start Date End Date Taking? Authorizing Provider  gabapentin (NEURONTIN) 100 MG capsule Take 1 capsule (100 mg total) by mouth 3 (three) times daily as needed. 10/17/22  Yes Lindey Renzulli, DO  methocarbamol (ROBAXIN) 500 MG tablet Take 1 tablet (500 mg total) by mouth 2 (two) times daily. 10/17/22  Yes Cristian Davitt, Seward Meth, DO  diltiazem (CARDIZEM) 30 MG tablet Take by mouth. 12/02/18 12/02/19  [provider]  ibuprofen (ADVIL) 200 MG tablet Take 800 mg by mouth every 6 (six) hours as needed. Last dose: 6 am. 05/30/21   [provider]   predniSONE (STERAPRED UNI-PAK 21 TAB) 10 MG (21) TBPK tablet Take by mouth daily. Take 6 tabs by mouth once daily on day 1 then decrease by 1 tablet each day until finished. 10/17/22   Katha Cabal, DO  sertraline (ZOLOFT) 50 MG tablet  09/30/18   [provider]    Family History Family History  Problem Relation Age of Onset   Healthy Mother    Healthy Father    Diabetes Paternal Uncle     Social History Social History   Tobacco Use   Smoking status: Never   Smokeless tobacco: Never  Vaping Use   Vaping status: Never Used  Substance Use Topics   Alcohol use: Yes    Comment: social   Drug use: Not Currently     Allergies   Patient has no known allergies.   Review of Systems Review of Systems: egative unless otherwise stated in HPI.      Physical Exam Triage Vital Signs ED Triage Vitals  Encounter Vitals Group     BP      Systolic BP Percentile      Diastolic BP Percentile      Pulse      Resp      Temp      Temp src  SpO2      Weight      Height      Head Circumference      Peak Flow      Pain Score      Pain Loc      Pain Education      Exclude from Growth Chart    No data found.  Updated Vital Signs BP (!) 132/92 (BP Location: Left Arm)   Pulse (!) 105   Resp 16   SpO2 100%   Visual Acuity Right Eye Distance:   Left Eye Distance:   Bilateral Distance:    Right Eye Near:   Left Eye Near:    Bilateral Near:     Physical Exam GEN: well appearing female in no acute distress  CVS: well perfused  RESP: speaking in full sentences without pause, no respiratory distress  MSK: spine: - Inspection: no gross deformity or asymmetry, swelling or ecchymosis. No skin changes  - Palpation: TTP over the lumbar and sacrum spinous processes, left lumbar paraspinal muscles, no SI joint tenderness bilaterally - ROM: full active ROM of the lumbar spine in flexion and extension but with pain especially with flexion  - Strength: 5/5 strength  of lower extremity in L4-S1 nerve root distributions b/l - Neuro: sensation intact in the L4-S1 nerve root distribution b/l, 2+ L4 and S1 reflexes - Special testing: positive left straight leg raise SKIN: warm, dry, no overly skin rash or erythema    UC Treatments / Results  Labs (all labs ordered are listed, but only abnormal results are displayed) Labs Reviewed - No data to display  EKG   Radiology DG Lumbar Spine Complete  Result Date: 10/17/2022 CLINICAL DATA:  Back pain. EXAM: LUMBAR SPINE - COMPLETE 4+ VIEW COMPARISON:  None Available. FINDINGS: Five lumbar type vertebral bodies. No acute fracture or subluxation. Vertebral body heights are preserved. Alignment is normal. Intervertebral disc spaces are maintained. The sacroiliac joints are unremarkable. IMPRESSION: Negative. Electronically Signed   By: Obie Dredge M.D.   On: 10/17/2022 09:55     Procedures Procedures (including critical care time)  Medications Ordered in UC Medications  ketorolac (TORADOL) injection 30 mg (30 mg Intramuscular Given 10/17/22 0907)    Initial Impression / Assessment and Plan / UC Course  I have reviewed the triage vital signs and the nursing notes.  Pertinent labs & imaging results that were available during my care of the patient were reviewed by me and considered in my medical decision making (see chart for details).      Pt is a 32 y.o.  female with acute on chronic lower back pain that got worse yesterday.  Given Toradol 30 mg IM. Recent serum Cr is normal.   Patient had an MR left lower extremity and lumbar spine in February 2023 at 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 Intact left acetabular labrum and left hip cartilage. Mild bilateral gluteus minimus and medius tendinosis. Mild degenerative changes greatest at L5-S1 with left subarticular disc protrusion abutting the descending left S1 nerve root and mild bilateral  foraminal narrowing at L4-L5 and L5-S1.   Obtained lumbar plain films.  Xray personally interpreted by me were unremarkable for fracture, or significant malalignment.  Radiologist report reviewed and notes normal vertebral disc spaces.    Patient to gradually return to normal activities, as tolerated and continue ordinary activities within the limits permitted by pain. Prescribed predinisone taper, Gabapentin and muscle  relaxer  for pain relief. Tylenol and Lidocaine patches PRN for multimodal pain relief. Counseled patient on red flag symptoms and when to seek immediate care. Patient to follow up with orthopedic provider and/or pain specialist  if symptoms do not improve with conservative treatment.  May need repeat MRI. Return and ED precautions given.   Discussed MDM, treatment plan and plan for follow-up with patient who agrees with plan.   Final Clinical Impressions(s) / UC Diagnoses   Final diagnoses:  Lumbosacral radiculopathy     Discharge Instructions      Follow up with your pain specialist. You may need a repeat MRI.   If medication was prescribed, stop by the pharmacy to pick up your prescriptions.  For your back pain, Take 1500 mg Tylenol twice a day, take muscle relaxer (Robaxin) twice a day, take Gabapentin  as needed for pain. Consider stopping by the pharmacy or dollar store to pick up some Lidocaine patches. Apply for 12 hours and then remove.   Watch for worsening symptoms such as an increasing weakness or loss of sensation in your arms or legs, increasing pain and/or the loss of bladder or bowel function. Should any of these occur, go to the emergency department immediately.       ED Prescriptions     Medication Sig Dispense Auth. Provider   predniSONE (STERAPRED UNI-PAK 21 TAB) 10 MG (21) TBPK tablet Take by mouth daily. Take 6 tabs by mouth once daily on day 1 then decrease by 1 tablet each day until finished. 21 tablet Kin Galbraith, DO   gabapentin  (NEURONTIN) 100 MG capsule Take 1 capsule (100 mg total) by mouth 3 (three) times daily as needed. 30 capsule Keo Schirmer, DO   methocarbamol (ROBAXIN) 500 MG tablet Take 1 tablet (500 mg total) by mouth 2 (two) times daily. 20 tablet Meron Bocchino, Seward Meth, DO      I have reviewed the PDMP during this encounter.   Katha Cabal, DO 10/17/22 1000

## 2022-12-08 ENCOUNTER — Ambulatory Visit
Admission: RE | Admit: 2022-12-08 | Discharge: 2022-12-08 | Disposition: A | Discharge status: Home / Self Care | Payer: Medicaid Other | Source: Ambulatory Visit | Attending: Physician Assistant | Admitting: Physician Assistant

## 2022-12-08 VITALS — BP 118/82 | HR 70 | Temp 98.8°F | Resp 17 | Wt 170.0 lb

## 2022-12-08 DIAGNOSIS — B3731 Acute candidiasis of vulva and vagina: Secondary | ICD-10-CM

## 2022-12-08 DIAGNOSIS — B9689 Other specified bacterial agents as the cause of diseases classified elsewhere: Secondary | ICD-10-CM

## 2022-12-08 DIAGNOSIS — N760 Acute vaginitis: Secondary | ICD-10-CM

## 2022-12-08 LAB — WET PREP, GENITAL
Sperm: NONE SEEN
Trich, Wet Prep: NONE SEEN
WBC, Wet Prep HPF POC: <10 (ref <10)

## 2022-12-08 LAB — URINALYSIS, W/ REFLEX TO CULTURE (INFECTION SUSPECTED)
Bilirubin Urine: NEGATIVE
Glucose, UA: NEGATIVE mg/dL
Hgb urine dipstick: NEGATIVE
Ketones, ur: NEGATIVE mg/dL
Leukocytes,Ua: NEGATIVE
Nitrite: NEGATIVE
Protein, ur: NEGATIVE mg/dL
Specific Gravity, Urine: 1.02 (ref 1.005–1.030)
pH: 7 (ref 5.0–8.0)

## 2022-12-08 MED ORDER — FLUCONAZOLE 150 MG PO TABS
150.0000 mg | ORAL_TABLET | ORAL | 0 refills | Status: AC
Start: 2022-12-08 — End: 2022-12-16

## 2022-12-08 MED ORDER — METRONIDAZOLE 500 MG PO TABS: 500.0000 mg | ORAL_TABLET | Freq: Two times a day (BID) | ORAL | 0 refills | Status: DC

## 2022-12-08 NOTE — ED Triage Notes (Signed)
Sx discharge,urinary frequency,odor, x 2 weeks.

## 2022-12-08 NOTE — Discharge Instructions (Addendum)
You tested positive for bacterial vaginosis and yeast.  Please start metronidazole twice daily for 7 days.  Do not drink any alcohol while on this medication for 3 days after completing course.  Take 1 dose of Diflucan today and a second dose next week.  Wear loosefitting cotton underwear and use hypoallergenic soaps and detergents.  If your symptoms are not improving or if anything worsens and you develop pelvic pain, abdominal pain, fever, nausea, vomiting you need to be seen immediately.

## 2022-12-08 NOTE — ED Provider Notes (Signed)
[facility]    CSN: [id number] Arrival date & time: 12/08/22  0815      History   Chief Complaint Chief Complaint  Patient presents with   Vaginal Discharge    May have an infection odor plus discharge and back pain - Entered by patient    HPI Mandy Robertson is a 32 y.o. female.   Patient presents today with a 2-week history of vaginal odor, vaginal discharge, urinary frequency.  Denies any pelvic pain, abdominal pain, fever, nausea, vomiting, dysuria, hematuria.  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  She has no specific concern for STI and denies any new exposures.  Denies any recent antibiotics.  She has not tried any over-the-counter medication for symptom management.  Denies history of recurrent UTI, recent urogenital procedure, recent catheterization, history of nephrolithiasis.  She has no concern for pregnancy.    Past Medical History:  Diagnosis Date   Anxiety    Arthritis    Tachycardia     There are no problems to display for this patient.   Past Surgical History:  Procedure Laterality Date   ABDOMINAL HYSTERECTOMY      OB History   No obstetric history on file.      Home Medications    Prior to Admission medications   Medication Sig Start Date End Date Taking? Authorizing Provider  fluconazole (DIFLUCAN) 150 MG tablet Take 1 tablet (150 mg total) by mouth once a week for 2 doses. 12/08/22 12/16/22 Yes Chioke Noxon K, PA-C  metroNIDAZOLE (FLAGYL) 500 MG tablet Take 1 tablet (500 mg total) by mouth 2 (two) times daily. 12/08/22  Yes Makar Slatter, Noberto Retort, PA-C  diltiazem (CARDIZEM) 30 MG tablet Take by mouth. 12/02/18 12/02/19  [provider]  gabapentin (NEURONTIN) 100 MG capsule Take 1 capsule (100 mg total) by mouth 3 (three) times daily as needed. 10/17/22   Katha Cabal, DO  ibuprofen (ADVIL) 200 MG tablet Take 800 mg by mouth every 6 (six) hours as needed. Last  dose: 6 am. 05/30/21   [provider]  methocarbamol (ROBAXIN) 500 MG tablet Take 1 tablet (500 mg total) by mouth 2 (two) times daily. 10/17/22   Brimage, Seward Meth, DO  predniSONE (STERAPRED UNI-PAK 21 TAB) 10 MG (21) TBPK tablet Take by mouth daily. Take 6 tabs by mouth once daily on day 1 then decrease by 1 tablet each day until finished. 10/17/22   Katha Cabal, DO  sertraline (ZOLOFT) 50 MG tablet  09/30/18   [provider]    Family History Family History  Problem Relation Age of Onset   Healthy Mother    Healthy Father    Diabetes Paternal Uncle     Social History Social History   Tobacco Use   Smoking status: Never   Smokeless tobacco: Never  Vaping Use   Vaping status: Never Used  Substance Use Topics   Alcohol use: Yes    Comment: social   Drug use: Not Currently     Allergies   Patient has no known allergies.   Review of Systems Review of Systems  Constitutional:  Positive for activity change. Negative for appetite change, fatigue and fever.  Respiratory:  Negative for shortness of breath.   Cardiovascular:  Negative for chest pain.  Gastrointestinal:  Negative for abdominal pain, diarrhea, nausea and vomiting.  Genitourinary:  Positive for vaginal discharge. Negative for dysuria, frequency, pelvic pain, urgency, vaginal bleeding  and vaginal pain.  Musculoskeletal:  Negative for arthralgias and myalgias.  Neurological:  Negative for dizziness, light-headedness and headaches.     Physical Exam Triage Vital Signs ED Triage Vitals  Encounter Vitals Group     BP 12/08/22 0825 118/82     Systolic BP Percentile --      Diastolic BP Percentile --      Pulse Rate 12/08/22 0825 70     Resp 12/08/22 0825 17     Temp 12/08/22 0825 98.8 F (37.1 C)     Temp Source 12/08/22 0825 Oral     SpO2 12/08/22 0825 99 %     Weight 12/08/22 0825 170 lb (77.1 kg)     Height --      Head Circumference --      Peak Flow --      Pain Score 12/08/22 0824  0     Pain Loc --      Pain Education --      Exclude from Growth Chart --    No data found.  Updated Vital Signs BP 118/82 (BP Location: Left Arm)   Pulse 70   Temp 98.8 F (37.1 C) (Oral)   Resp 17   Wt 170 lb (77.1 kg)   SpO2 99%   BMI 29.18 kg/m   Visual Acuity Right Eye Distance:   Left Eye Distance:   Bilateral Distance:    Right Eye Near:   Left Eye Near:    Bilateral Near:     Physical Exam Vitals reviewed.  Constitutional:      General: She is awake. She is not in acute distress.    Appearance: Normal appearance. She is well-developed. She is not ill-appearing.     Comments: Very pleasant female appears stated age in no acute distress sitting comfortably in exam room  HENT:     Head: Normocephalic and atraumatic.  Cardiovascular:     Rate and Rhythm: Normal rate and regular rhythm.     Heart sounds: Normal heart sounds, S1 normal and S2 normal. No murmur heard. Pulmonary:     Effort: Pulmonary effort is normal.     Breath sounds: Normal breath sounds. No wheezing, rhonchi or rales.     Comments: Clear to auscultation bilaterally Abdominal:     General: Bowel sounds are normal.     Palpations: Abdomen is soft.     Tenderness: There is no abdominal tenderness. There is no right CVA tenderness, left CVA tenderness, guarding or rebound.     Comments: Benign abdominal exam  Psychiatric:        Behavior: Behavior is cooperative.      UC Treatments / Results  Labs (all labs ordered are listed, but only abnormal results are displayed) Labs Reviewed  WET PREP, GENITAL - Abnormal; Notable for the following components:      Result Value   Yeast Wet Prep HPF POC PRESENT (*)    Clue Cells Wet Prep HPF POC PRESENT (*)    All other components within normal limits  URINALYSIS, W/ REFLEX TO CULTURE (INFECTION SUSPECTED) - Abnormal; Notable for the following components:   APPearance CLOUDY (*)    Bacteria, UA FEW (*)    All other components within normal limits     EKG   Radiology No results found.  Procedures Procedures (including critical care time)  Medications Ordered in UC Medications - No data to display  Initial Impression / Assessment and Plan / UC Course  I have reviewed  the triage vital signs and the nursing notes.  Pertinent labs & imaging results that were available during my care of the patient were reviewed by me and considered in my medical decision making (see chart for details).     Patient is well-appearing, afebrile, nontoxic, nontachycardic.  Vital signs and physical exam are reassuring with no indication for emergent evaluation or imaging.  UA was cloudy with some bacteria on microscopic evaluation but no other evidence of infection.  I suspect this is related to bacterial vaginosis as she did have positive wet prep with clue cells and yeast.  Will treat for BV and yeast vaginitis.  Metronidazole sent to pharmacy with instruction to avoid alcohol while on this medication for 3 days after completing course due to associated Antabuse side effects.  2 doses of Diflucan were sent with instruction to take this 1 week apart for additional symptom relief.  She is to wear loosefitting cotton underwear and use hypoallergenic soaps and detergents.  Offered STI testing which she declined associated low suspicion for STI as contributing to symptoms.  Discussed that if she has any worsening or changing symptoms she needs to be seen immediately.  Strict return precautions given.  All questions answered to patient satisfaction.  Final Clinical Impressions(s) / UC Diagnoses   Final diagnoses:  Acute vaginitis  BV (bacterial vaginosis)  Vaginal yeast infection     Discharge Instructions      You tested positive for bacterial vaginosis and yeast.  Please start metronidazole twice daily for 7 days.  Do not drink any alcohol while on this medication for 3 days after completing course.  Take 1 dose of Diflucan today and a second dose next  week.  Wear loosefitting cotton underwear and use hypoallergenic soaps and detergents.  If your symptoms are not improving or if anything worsens and you develop pelvic pain, abdominal pain, fever, nausea, vomiting you need to be seen immediately.     ED Prescriptions     Medication Sig Dispense Auth. Provider   metroNIDAZOLE (FLAGYL) 500 MG tablet Take 1 tablet (500 mg total) by mouth 2 (two) times daily. 14 tablet Piero Mustard K, PA-C   fluconazole (DIFLUCAN) 150 MG tablet Take 1 tablet (150 mg total) by mouth once a week for 2 doses. 2 tablet Peta Peachey, Noberto Retort, PA-C      PDMP not reviewed this encounter.   Jeani Hawking, PA-C 12/08/22 [id number]

## 2023-04-02 ENCOUNTER — Emergency Department
Admission: EM | Admit: 2023-04-02 | Discharge: 2023-04-03 | Disposition: A | Discharge status: Home / Self Care | Payer: Medicaid Other | Attending: Emergency Medicine | Admitting: Emergency Medicine

## 2023-04-02 ENCOUNTER — Other Ambulatory Visit: Payer: Self-pay

## 2023-04-02 ENCOUNTER — Ambulatory Visit
Admission: RE | Admit: 2023-04-02 | Discharge: 2023-04-02 | Disposition: A | Discharge status: Home / Self Care | Payer: Medicaid Other | Source: Ambulatory Visit | Attending: Emergency Medicine

## 2023-04-02 VITALS — BP 125/82 | HR 91 | Temp 97.8°F | Resp 18 | Ht 64.0 in | Wt 170.0 lb

## 2023-04-02 DIAGNOSIS — R1031 Right lower quadrant pain: Secondary | ICD-10-CM | POA: Diagnosis present

## 2023-04-02 DIAGNOSIS — N939 Abnormal uterine and vaginal bleeding, unspecified: Secondary | ICD-10-CM | POA: Insufficient documentation

## 2023-04-02 DIAGNOSIS — N898 Other specified noninflammatory disorders of vagina: Secondary | ICD-10-CM | POA: Insufficient documentation

## 2023-04-02 LAB — POCT URINALYSIS DIP (MANUAL ENTRY)
Bilirubin, UA: NEGATIVE
Blood, UA: NEGATIVE
Glucose, UA: NEGATIVE mg/dL
Ketones, POC UA: NEGATIVE mg/dL
Leukocytes, UA: NEGATIVE
Nitrite, UA: NEGATIVE
Protein Ur, POC: NEGATIVE mg/dL
Spec Grav, UA: 1.025 (ref 1.010–1.025)
Urobilinogen, UA: 0.2 U/dL
pH, UA: 6 (ref 5.0–8.0)

## 2023-04-02 LAB — CBC WITH DIFFERENTIAL/PLATELET
Abs Immature Granulocytes: 0.02 10*3/uL (ref 0.00–0.07)
Basophils Absolute: 0 10*3/uL (ref 0.0–0.1)
Basophils Relative: 0 %
Eosinophils Absolute: 0 10*3/uL (ref 0.0–0.5)
Eosinophils Relative: 1 %
HCT: 40.7 % (ref 36.0–46.0)
Hemoglobin: 14 g/dL (ref 12.0–15.0)
Immature Granulocytes: 0 %
Lymphocytes Relative: 26 %
Lymphs Abs: 1.9 10*3/uL (ref 0.7–4.0)
MCH: 29.5 pg (ref 26.0–34.0)
MCHC: 34.4 g/dL (ref 30.0–36.0)
MCV: 85.7 fL (ref 80.0–100.0)
Monocytes Absolute: 0.4 10*3/uL (ref 0.1–1.0)
Monocytes Relative: 6 %
Neutro Abs: 4.9 10*3/uL (ref 1.7–7.7)
Neutrophils Relative %: 67 %
Platelets: 158 10*3/uL (ref 150–400)
RBC: 4.75 MIL/uL (ref 3.87–5.11)
RDW: 13.2 % (ref 11.5–15.5)
WBC: 7.3 10*3/uL (ref 4.0–10.5)
nRBC: 0 % (ref 0.0–0.2)

## 2023-04-02 LAB — BASIC METABOLIC PANEL
Anion gap: 9 (ref 5–15)
BUN: 14 mg/dL (ref 6–20)
CO2: 21 mmol/L — ABNORMAL LOW (ref 22–32)
Calcium: 8.7 mg/dL — ABNORMAL LOW (ref 8.9–10.3)
Chloride: 104 mmol/L (ref 98–111)
Creatinine, Ser: 0.78 mg/dL (ref 0.44–1.00)
GFR, Estimated: >60 mL/min (ref >60)
Glucose, Bld: 95 mg/dL (ref 70–99)
Potassium: 3.7 mmol/L (ref 3.5–5.1)
Sodium: 134 mmol/L — ABNORMAL LOW (ref 135–145)

## 2023-04-02 NOTE — Discharge Instructions (Signed)
Go to the emergency department for evaluation of your right lower abdominal pain and other symptoms.

## 2023-04-02 NOTE — ED Triage Notes (Signed)
Patient states about 2 weeks of vaginal itching and white discharge as well as urinary frequency.  Abdominal pain started right at a week ago.  Wants STI testing as well.

## 2023-04-02 NOTE — ED Notes (Signed)
Patient is being discharged from the Urgent Care and sent to the Emergency Department via private vehicle . Per NP Arlana Pouch, patient is in need of higher level of care due to RLQ abdominal tenderness. Patient is aware and verbalizes understanding of plan of care.  Vitals:   04/02/23 1605  BP: 125/82  Pulse: 91  Resp: 18  Temp: 97.8 F (36.6 C)  SpO2: 98%

## 2023-04-02 NOTE — ED Provider Notes (Signed)
[facility] Provider Note    Event Date/Time   First MD Initiated Contact with Patient 04/02/23 2326     (approximate)   History   Abdominal Pain (Pt. Reports abdominal pain x 1 week, worse with palpation, denies urinary symptoms, seen at urgent care today for same)   HPI  Mandy Robertson is a 32 y.o. female  ***with a history of anxiety, arthritis, tachycardia who presents with  1 week whitish vaginal discharge, some RLQ abd pain, no n/v.  No significant pain unless pressed on  I reviewed the past medical records.  The patient was seen at urgent care earlier today reporting vaginal discharge, itching, frequency, and lower abdominal pain.  She had right lower quadrant tenderness and was referred to the ED for further evaluation.   Physical Exam   Triage Vital Signs: ED Triage Vitals  Encounter Vitals Group     BP 04/02/23 1722 125/89     Systolic BP Percentile --      Diastolic BP Percentile --      Pulse Rate 04/02/23 1722 89     Resp 04/02/23 1722 17     Temp 04/02/23 1722 98.2 F (36.8 C)     Temp Source 04/02/23 1722 Oral     SpO2 04/02/23 1722 100 %     Weight 04/02/23 1723 170 lb (77.1 kg)     Height 04/02/23 1723 5\' 4"  (1.626 m)     Head Circumference --      Peak Flow --      Pain Score 04/02/23 1736 5     Pain Loc --      Pain Education --      Exclude from Growth Chart --     Most recent vital signs: Vitals:   04/02/23 1722  BP: 125/89  Pulse: 89  Resp: 17  Temp: 98.2 F (36.8 C)  SpO2: 100%    {Only need to document appropriate and relevant physical exam:1} General: Awake, no distress. *** CV:  Good peripheral perfusion. *** Resp:  Normal effort. *** Abd:  No distention. *** Other:  ***   ED Results / Procedures / Treatments   Labs (all labs ordered are listed, but only abnormal results are displayed) Labs Reviewed  BASIC METABOLIC PANEL - Abnormal; Notable for the following components:      Result Value   Sodium  134 (*)    CO2 21 (*)    Calcium 8.7 (*)    All other components within normal limits  CBC WITH DIFFERENTIAL/PLATELET     EKG  ***   RADIOLOGY *** {USE THE WORD "INTERPRETED"!! You MUST document your own interpretation of imaging, as well as the fact that you reviewed the radiologist's report!:1}   PROCEDURES:  Critical Care performed: {CriticalCareYesNo:19197::"Yes, see critical care procedure note(s)","No"}  Procedures   MEDICATIONS ORDERED IN ED: Medications - No data to display   IMPRESSION / MDM / ASSESSMENT AND PLAN / ED COURSE  I reviewed the triage vital signs and the nursing notes.                              Differential diagnosis includes, but is not limited to, ***  Patient's presentation is most consistent with {EM COPA:27473}       FINAL CLINICAL IMPRESSION(S) / ED DIAGNOSES   Final diagnoses:  None     Rx / DC Orders   ED Discharge Orders  None        Note:  This document was prepared using Dragon voice recognition software and may include unintentional dictation errors.

## 2023-04-02 NOTE — ED Provider Notes (Signed)
[facility]    CSN: [id number] Arrival date & time: 04/02/23  1548      History   Chief Complaint Chief Complaint  Patient presents with   Vaginal Discharge    Entered by patient   Vaginal Itching   Abdominal Pain    HPI Mandy Robertson is a 32 y.o. female.  Patient presents with 2-week history of vaginal discharge, vaginal itching, urinary frequency, lower abdominal pain.  The lower abdominal pain is worse today.  No treatment attempted.  She requests urine and STD testing.  No fever, hematuria, flank pain, vomiting, diarrhea, constipation.  Her medical history includes hysterectomy.  The history is provided by the patient and medical records.    Past Medical History:  Diagnosis Date   Anxiety    Arthritis    Tachycardia     There are no active problems to display for this patient.   Past Surgical History:  Procedure Laterality Date   ABDOMINAL HYSTERECTOMY      OB History   No obstetric history on file.      Home Medications    Prior to Admission medications   Medication Sig Start Date End Date Taking? Authorizing Provider  diltiazem (CARDIZEM) 30 MG tablet Take by mouth. 12/02/18 12/02/19  [provider]  gabapentin (NEURONTIN) 100 MG capsule Take 1 capsule (100 mg total) by mouth 3 (three) times daily as needed. 10/17/22   Katha Cabal, DO  ibuprofen (ADVIL) 200 MG tablet Take 800 mg by mouth every 6 (six) hours as needed. Last dose: 6 am. 05/30/21   [provider]  methocarbamol (ROBAXIN) 500 MG tablet Take 1 tablet (500 mg total) by mouth 2 (two) times daily. 10/17/22   Brimage, Seward Meth, DO  metroNIDAZOLE (FLAGYL) 500 MG tablet Take 1 tablet (500 mg total) by mouth 2 (two) times daily. 12/08/22   Raspet, Noberto Retort, PA-C  predniSONE (STERAPRED UNI-PAK 21 TAB) 10 MG (21) TBPK tablet Take by mouth daily. Take 6 tabs by mouth once daily on day 1 then decrease by 1 tablet each day until finished. 10/17/22   Katha Cabal, DO  sertraline  (ZOLOFT) 50 MG tablet  09/30/18   [provider]    Family History Family History  Problem Relation Age of Onset   Healthy Mother    Healthy Father    Diabetes Paternal Uncle     Social History Social History   Tobacco Use   Smoking status: Never   Smokeless tobacco: Never  Vaping Use   Vaping status: Some Days  Substance Use Topics   Alcohol use: Yes    Comment: social   Drug use: Not Currently     Allergies   Patient has no known allergies.   Review of Systems Review of Systems  Constitutional:  Negative for chills and fever.  Gastrointestinal:  Positive for abdominal pain. Negative for blood in stool, constipation, diarrhea, nausea and vomiting.  Genitourinary:  Positive for frequency and vaginal discharge. Negative for dysuria, hematuria and pelvic pain.     Physical Exam Triage Vital Signs ED Triage Vitals  Encounter Vitals Group     BP      Systolic BP Percentile      Diastolic BP Percentile      Pulse      Resp      Temp      Temp src      SpO2      Weight      Height  Head Circumference      Peak Flow      Pain Score      Pain Loc      Pain Education      Exclude from Growth Chart    No data found.  Updated Vital Signs BP 125/82   Pulse 91   Temp 97.8 F (36.6 C)   Resp 18   Ht 5\' 4"  (1.626 m)   Wt 170 lb (77.1 kg)   SpO2 98%   BMI 29.18 kg/m   Visual Acuity Right Eye Distance:   Left Eye Distance:   Bilateral Distance:    Right Eye Near:   Left Eye Near:    Bilateral Near:     Physical Exam Constitutional:      General: She is not in acute distress. HENT:     Mouth/Throat:     Mouth: Mucous membranes are moist.  Cardiovascular:     Rate and Rhythm: Normal rate and regular rhythm.  Pulmonary:     Effort: Pulmonary effort is normal. No respiratory distress.  Abdominal:     General: Bowel sounds are normal.     Palpations: Abdomen is soft.     Tenderness: There is abdominal tenderness in the right lower  quadrant. There is no right CVA tenderness, left CVA tenderness, guarding or rebound.  Neurological:     Mental Status: She is alert.      UC Treatments / Results  Labs (all labs ordered are listed, but only abnormal results are displayed) Labs Reviewed  POCT URINALYSIS DIP (MANUAL ENTRY)  CERVICOVAGINAL ANCILLARY ONLY    EKG   Radiology No results found.  Procedures Procedures (including critical care time)  Medications Ordered in UC Medications - No data to display  Initial Impression / Assessment and Plan / UC Course  I have reviewed the triage vital signs and the nursing notes.  Pertinent labs & imaging results that were available during my care of the patient were reviewed by me and considered in my medical decision making (see chart for details).    RLQ abdominal pain, vaginal discharge, vaginal itching.  Afebrile and vital signs are stable.  Patient is tender to palpation of her right lower quadrant.  Her urine is normal.  She has had a hysterectomy but still has her ovaries.  Discussed limitations of evaluation of right lower quadrant abdominal pain in an urgent care setting.  Sending her to the ED for evaluation.  She is agreeable to this.  She drove herself here and will drive herself to [facility] ED.  Final Clinical Impressions(s) / UC Diagnoses   Final diagnoses:  Right lower quadrant abdominal pain  Vaginal discharge  Vaginal itching     Discharge Instructions      Go to the emergency department for evaluation of your right lower abdominal pain and other symptoms.       ED Prescriptions   None    PDMP not reviewed this encounter.   Mickie Bail, NP 04/02/23 [phone]

## 2023-04-02 NOTE — ED Notes (Signed)
Pt to ED with c/o vaginal discharge, odor, and lower abdominal cramping. Offers that the discharge/odor has been for 2 weeks and cramping x 1. Was seen at urgent care earlier.

## 2023-04-03 ENCOUNTER — Emergency Department: Payer: Medicaid Other

## 2023-04-03 LAB — CHLAMYDIA/NGC RT PCR (ARMC ONLY)
Chlamydia Tr: NOT DETECTED
N gonorrhoeae: NOT DETECTED

## 2023-04-03 LAB — WET PREP, GENITAL
Clue Cells Wet Prep HPF POC: NONE SEEN
Sperm: NONE SEEN
Trich, Wet Prep: NONE SEEN
WBC, Wet Prep HPF POC: <10 (ref <10)
Yeast Wet Prep HPF POC: NONE SEEN

## 2023-04-03 MED ORDER — IOHEXOL 300 MG/ML  SOLN
100.0000 mL | Freq: Once | INTRAMUSCULAR | Status: AC | PRN
  Administered 2023-04-03: 100 mL via INTRAVENOUS

## 2023-04-05 ENCOUNTER — Telehealth (HOSPITAL_COMMUNITY): Payer: Self-pay

## 2023-04-05 LAB — CERVICOVAGINAL ANCILLARY ONLY
Bacterial Vaginitis (gardnerella): POSITIVE — AB
Candida Glabrata: NEGATIVE
Candida Vaginitis: POSITIVE — AB
Chlamydia: NEGATIVE
Comment: NEGATIVE
Comment: NEGATIVE
Comment: NEGATIVE
Comment: NEGATIVE
Comment: NEGATIVE
Comment: NORMAL
Neisseria Gonorrhea: NEGATIVE
Trichomonas: NEGATIVE

## 2023-04-05 MED ORDER — FLUCONAZOLE 150 MG PO TABS: 150.0000 mg | ORAL_TABLET | Freq: Once | ORAL | 0 refills | Status: AC

## 2023-04-05 MED ORDER — METRONIDAZOLE 500 MG PO TABS: 500.0000 mg | ORAL_TABLET | Freq: Two times a day (BID) | ORAL | 0 refills | Status: AC

## 2023-04-05 NOTE — Telephone Encounter (Signed)
Per protocol, pt requires tx with metronidazole and Diflucan.  Rx sent to pharmacy on file.

## 2023-11-02 ENCOUNTER — Ambulatory Visit
Admission: RE | Admit: 2023-11-02 | Discharge: 2023-11-02 | Disposition: A | Discharge status: Home / Self Care | Payer: Self-pay | Source: Ambulatory Visit | Attending: Physician Assistant | Admitting: Physician Assistant

## 2023-11-02 VITALS — BP 130/87 | HR 96 | Temp 98.9°F | Resp 18 | Wt 147.0 lb

## 2023-11-02 DIAGNOSIS — R1031 Right lower quadrant pain: Secondary | ICD-10-CM | POA: Insufficient documentation

## 2023-11-02 DIAGNOSIS — M25552 Pain in left hip: Secondary | ICD-10-CM | POA: Diagnosis not present

## 2023-11-02 DIAGNOSIS — M25551 Pain in right hip: Secondary | ICD-10-CM | POA: Diagnosis not present

## 2023-11-02 DIAGNOSIS — G8929 Other chronic pain: Secondary | ICD-10-CM | POA: Insufficient documentation

## 2023-11-02 LAB — URINALYSIS, W/ REFLEX TO CULTURE (INFECTION SUSPECTED)
Bilirubin Urine: NEGATIVE
Glucose, UA: NEGATIVE mg/dL
Hgb urine dipstick: NEGATIVE
Ketones, ur: NEGATIVE mg/dL
Leukocytes,Ua: NEGATIVE
Nitrite: NEGATIVE
Protein, ur: NEGATIVE mg/dL
RBC / HPF: NONE SEEN RBC/hpf (ref 0–5)
Specific Gravity, Urine: 1.025 (ref 1.005–1.030)
pH: 7 (ref 5.0–8.0)

## 2023-11-02 LAB — CBC WITH DIFFERENTIAL/PLATELET
Abs Immature Granulocytes: 0.03 K/uL (ref 0.00–0.07)
Basophils Absolute: 0 K/uL (ref 0.0–0.1)
Basophils Relative: 0 %
Eosinophils Absolute: 0 K/uL (ref 0.0–0.5)
Eosinophils Relative: 1 %
HCT: 41.3 % (ref 36.0–46.0)
Hemoglobin: 14.2 g/dL (ref 12.0–15.0)
Immature Granulocytes: 1 %
Lymphocytes Relative: 30 %
Lymphs Abs: 1.7 K/uL (ref 0.7–4.0)
MCH: 28.3 pg (ref 26.0–34.0)
MCHC: 34.4 g/dL (ref 30.0–36.0)
MCV: 82.4 fL (ref 80.0–100.0)
Monocytes Absolute: 0.4 K/uL (ref 0.1–1.0)
Monocytes Relative: 6 %
Neutro Abs: 3.5 K/uL (ref 1.7–7.7)
Neutrophils Relative %: 62 %
Platelets: 170 K/uL (ref 150–400)
RBC: 5.01 MIL/uL (ref 3.87–5.11)
RDW: 13.9 % (ref 11.5–15.5)
WBC: 5.6 K/uL (ref 4.0–10.5)
nRBC: 0 % (ref 0.0–0.2)

## 2023-11-02 LAB — COMPREHENSIVE METABOLIC PANEL WITH GFR
ALT: 13 U/L (ref 0–44)
AST: 17 U/L (ref 15–41)
Albumin: 4 g/dL (ref 3.5–5.0)
Alkaline Phosphatase: 49 U/L (ref 38–126)
Anion gap: 8 (ref 5–15)
BUN: 12 mg/dL (ref 6–20)
CO2: 24 mmol/L (ref 22–32)
Calcium: 8.8 mg/dL — ABNORMAL LOW (ref 8.9–10.3)
Chloride: 103 mmol/L (ref 98–111)
Creatinine, Ser: 0.74 mg/dL (ref 0.44–1.00)
GFR, Estimated: >60 mL/min (ref >60)
Glucose, Bld: 88 mg/dL (ref 70–99)
Potassium: 3.8 mmol/L (ref 3.5–5.1)
Sodium: 135 mmol/L (ref 135–145)
Total Bilirubin: 1.2 mg/dL (ref 0.0–1.2)
Total Protein: 7.9 g/dL (ref 6.5–8.1)

## 2023-11-02 MED ORDER — KETOROLAC TROMETHAMINE 60 MG/2ML IM SOLN
30.0000 mg | Freq: Once | INTRAMUSCULAR | Status: AC
  Administered 2023-11-02: 30 mg via INTRAMUSCULAR

## 2023-11-02 NOTE — ED Provider Notes (Signed)
 [facility]    CSN: [id number] Arrival date & time: 11/02/23  9146      History   Chief Complaint Chief Complaint  Patient presents with   Abdominal Pain    Pelvic pain - Entered by patient    HPI Mandy Robertson is a 33 y.o. 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   Patient presents today for right lower quadrant abdominal pain, right groin pain and right hip pain yesterday. Patient says she has never really had abdominal pain related to her chronic hip condition before. Pain is intermittent and worse with movement. Pain is sharp. No associated fever, fatigue, n/v/d, constipation, dysuria, urinary urgency/frequency, vaginal discharge or flank pain. Pain is 8/10 and has not worsened since it began at 12 pm yesterday. Has taken tylenol  without relief.  Upcoming appointment with ortho next week for hip injections.  Surgical history significant for abdominal hysterectomy.  HPI  Past Medical History:  Diagnosis Date   Anxiety    Arthritis    Tachycardia     There are no active problems to display for this patient.   Past Surgical History:  Procedure Laterality Date   ABDOMINAL HYSTERECTOMY      OB History   No obstetric history on file.      Home Medications    Prior to Admission medications   Medication Sig Start Date End Date Taking? Authorizing Provider  cyclobenzaprine (FLEXERIL) 5 MG tablet Take 5 mg by mouth daily as needed for muscle spasms.   Yes [provider]  diltiazem (CARDIZEM) 30 MG tablet Take by mouth. 12/02/18 12/02/19  [provider]  ibuprofen  (ADVIL ) 200 MG tablet Take 800 mg by mouth every 6 (six) hours as needed. Last dose: 6 am. 05/30/21   [provider]  sertraline (ZOLOFT) 50 MG tablet  09/30/18   [provider]    Family History Family History  Problem Relation Age of Onset   Healthy Mother    Healthy Father     Diabetes Paternal Uncle     Social History Social History   Tobacco Use   Smoking status: Never    Passive exposure: Never   Smokeless tobacco: Never  Vaping Use   Vaping status: Some Days  Substance Use Topics   Alcohol use: Yes    Comment: social   Drug use: Not Currently     Allergies   Patient has no known allergies.   Review of Systems Review of Systems  Constitutional:  Negative for appetite change, fatigue and fever.  HENT:  Negative for congestion.   Respiratory:  Negative for cough and shortness of breath.   Cardiovascular:  Negative for chest pain.  Gastrointestinal:  Positive for abdominal pain. Negative for blood in stool, constipation, diarrhea, nausea, rectal pain and vomiting.  Genitourinary:  Negative for difficulty urinating, dysuria, flank pain, frequency, urgency and vaginal discharge.  Musculoskeletal:  Positive for arthralgias. Negative for back pain and joint swelling.  Neurological:  Negative for weakness.     Physical Exam Triage Vital Signs ED Triage Vitals  Encounter Vitals Group     BP 11/02/23 0912 130/87     Girls Systolic BP Percentile --      Girls Diastolic BP Percentile --      Boys Systolic BP Percentile --      Boys Diastolic BP Percentile --      Pulse Rate 11/02/23 0912 96  Resp 11/02/23 0912 18     Temp 11/02/23 0912 98.9 F (37.2 C)     Temp Source 11/02/23 0912 Oral     SpO2 11/02/23 0912 98 %     Weight 11/02/23 0911 147 lb (66.7 kg)     Height --      Head Circumference --      Peak Flow --      Pain Score 11/02/23 0908 8     Pain Loc --      Pain Education --      Exclude from Growth Chart --    No data found.  Updated Vital Signs BP 130/87 (BP Location: Right Arm)   Pulse 96   Temp 98.9 F (37.2 C) (Oral)   Resp 18   Wt 147 lb (66.7 kg)   SpO2 98%   BMI 25.23 kg/m      Physical Exam Vitals and nursing note reviewed.  Constitutional:      General: She is not in acute distress.    Appearance:  Normal appearance. She is not ill-appearing or toxic-appearing.  HENT:     Head: Normocephalic and atraumatic.  Eyes:     General: No scleral icterus.       Right eye: No discharge.        Left eye: No discharge.     Conjunctiva/sclera: Conjunctivae normal.  Cardiovascular:     Rate and Rhythm: Normal rate and regular rhythm.     Heart sounds: Normal heart sounds.  Pulmonary:     Effort: Pulmonary effort is normal. No respiratory distress.     Breath sounds: Normal breath sounds.  Abdominal:     Palpations: Abdomen is soft.     Tenderness: There is abdominal tenderness in the right lower quadrant, periumbilical area and suprapubic area. There is no right CVA tenderness, left CVA tenderness, guarding or rebound. Negative signs include Rovsing's sign.  Musculoskeletal:     Cervical back: Neck supple.     Right hip: Tenderness (lateral hip) present. Decreased range of motion.     Comments: Painful internal and external rotation of right hip  Skin:    General: Skin is dry.  Neurological:     General: No focal deficit present.     Mental Status: She is alert. Mental status is at baseline.     Motor: No weakness.     Gait: Gait normal.  Psychiatric:        Mood and Affect: Mood normal.        Behavior: Behavior normal.      UC Treatments / Results  Labs (all labs ordered are listed, but only abnormal results are displayed) Labs Reviewed  URINALYSIS, W/ REFLEX TO CULTURE (INFECTION SUSPECTED) - Abnormal; Notable for the following components:      Result Value   Bacteria, UA FEW (*)    All other components within normal limits  COMPREHENSIVE METABOLIC PANEL WITH GFR - Abnormal; Notable for the following components:   Calcium 8.8 (*)    All other components within normal limits  CBC WITH DIFFERENTIAL/PLATELET  CERVICOVAGINAL ANCILLARY ONLY    EKG   Radiology No results found. Study Result  Narrative & Impression  CLINICAL DATA:  Right lower quadrant pain   EXAM: CT  ABDOMEN AND PELVIS WITH CONTRAST   TECHNIQUE: Multidetector CT imaging of the abdomen and pelvis was performed using the standard protocol following bolus administration of intravenous contrast.   RADIATION DOSE REDUCTION: This exam was performed according  to the departmental dose-optimization program which includes automated exposure control, adjustment of the mA and/or kV according to patient size and/or use of iterative reconstruction technique.   CONTRAST:  OMNIPAQUE  IOHEXOL  300 MG/ML  SOLN   COMPARISON:  09/25/2018   FINDINGS: Lower chest: No acute abnormality.   Hepatobiliary: No focal liver abnormality is seen. No gallstones, gallbladder wall thickening, or biliary dilatation.   Pancreas: Unremarkable. No pancreatic ductal dilatation or surrounding inflammatory changes.   Spleen: Normal in size without focal abnormality.   Adrenals/Urinary Tract: Adrenal glands are within normal limits. Kidneys are well visualized bilaterally. No renal calculi or obstructive changes are noted. The bladder is decompressed.   Stomach/Bowel: The appendix is within normal limits. No obstructive or inflammatory changes of the colon are noted. Stomach and small bowel are within normal limits.   Vascular/Lymphatic: No significant vascular findings are present. No enlarged abdominal or pelvic lymph nodes.   Reproductive: Status post hysterectomy. No adnexal masses.   Other: No abdominal wall hernia or abnormality. No abdominopelvic ascites.   Musculoskeletal: No acute or significant osseous findings.   IMPRESSION: No acute abnormality to correspond with the given clinical history.     Electronically Signed   By: Oneil Devonshire M.D.   On: 04/03/2023 01:13   Procedures Procedures (including critical care time)  Medications Ordered in UC Medications  ketorolac  (TORADOL ) injection 30 mg (30 mg Intramuscular Given 11/02/23 0953)    Initial Impression / Assessment and Plan /  UC Course  I have reviewed the triage vital signs and the nursing notes.  Pertinent labs & imaging results that were available during my care of the patient were reviewed by me and considered in my medical decision making (see chart for details).   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  Pain is worse with movement and is sharp and intermittent.  No associated fever, fatigue, nausea/vomiting/diarrhea, constipation, urinary symptoms, vaginal symptoms.  No improvement with Tylenol .  Pain currently 8 out of 10.  Patient's medical history significant for hysterectomy.  Vitals are stable and normal and she is overall well-appearing.  No acute distress.  She has mild periumbilical and suprapubic abdominal tenderness and moderate tenderness of the right lower quadrant.  Tenderness throughout the right lateral hip and reduced range of motion of hip due to discomfort.  Chest clear.  Heart regular rate and rhythm.  Discussed potential causes of her symptoms.  Advised I cannot 100% rule out appendicitis and it is always a concern with right lower quadrant abdominal pain.  We discussed going to emergency department versus obtaining lab work and receiving Toradol  injection in the clinic with close monitoring and going to ED if symptoms worsen.  Patient states he will avoid going to the ER at this time.  Urinalysis, vaginal swab, CBC, CMP ordered.  Patient given 30 mg IM ketorolac  injection for acute pain relief.  UA negative.   CBC and CMP without any significantly abnormal findings.   Reviewed all results with patient.   I again expressed my concern for possible appendicitis and need for CT scan but patient would like to go home and monitor symptoms.  Will continue over-the-counter ibuprofen  and Tylenol , home Flexeril, heat, ice and  close monitoring.  Discussed going to emergency department if symptoms are not improving or if they worsen.  If she ever has  associated fever, fatigue, vomiting, etc. she should call 911 or go immediately to the ER.   Final Clinical Impressions(s) / UC Diagnoses   Final diagnoses:  Right lower quadrant abdominal pain  Chronic hip pain, bilateral     Discharge Instructions      -Normal urinalysis -Labs were normal -Pain could be related to your chronic hip condition. Continue prescription medications and following up with orthopedics -We discussed other possibilities for your pain including appendicitis which I explained could not be ruled out today. If pain is not improving today or if it worsens you should go to ER to have a CT scan performed to evaluate for appendicitis which is a surgical emergency and can result in threat to life if not treated immediately.     ED Prescriptions   None    I have reviewed the PDMP during this encounter.   Arvis Jolan NOVAK, PA-C 11/02/23 1039

## 2023-11-02 NOTE — Discharge Instructions (Addendum)
-  Normal urinalysis -Labs were normal -Pain could be related to your chronic hip condition. Continue prescription medications and following up with orthopedics -We discussed other possibilities for your pain including appendicitis which I explained could not be ruled out today. If pain is not improving today or if it worsens you should go to ER to have a CT scan performed to evaluate for appendicitis which is a surgical emergency and can result in threat to life if not treated immediately.

## 2023-11-02 NOTE — ED Triage Notes (Signed)
 Pelvic pain - Entered by patient  Pt presents c/o sharp pelvic pain that does not radiate that onset last night. Pt reports she felt the same pain a long time ago previous to having a hysterectomy. Pt says she is not sure if it is related to the bursitis in her hips that causes pain as well.

## 2023-11-04 LAB — CERVICOVAGINAL ANCILLARY ONLY
Bacterial Vaginitis (gardnerella): POSITIVE — AB
Candida Glabrata: NEGATIVE
Candida Vaginitis: NEGATIVE
Chlamydia: NEGATIVE
Comment: NEGATIVE
Comment: NEGATIVE
Comment: NEGATIVE
Comment: NEGATIVE
Comment: NEGATIVE
Comment: NORMAL
Neisseria Gonorrhea: NEGATIVE
Trichomonas: NEGATIVE

## 2023-11-05 ENCOUNTER — Ambulatory Visit (HOSPITAL_COMMUNITY): Payer: Self-pay
